# Patient Record
Sex: Female | Born: 1975 | Hispanic: No | Marital: Married | Smoking: Never smoker
Health system: Southern US, Community
[De-identification: ages and names within clinical notes are randomized; demographics above are authoritative.]

## PROBLEM LIST (undated history)

## (undated) DIAGNOSIS — Z6721 Type B blood, Rh negative: Secondary | ICD-10-CM

## (undated) HISTORY — PX: COLPOSCOPY: SHX161

## (undated) HISTORY — DX: Type B blood, Rh negative: Z67.21

---

## 2005-08-06 ENCOUNTER — Other Ambulatory Visit: Admission: RE | Admit: 2005-08-06 | Discharge: 2005-08-06 | Payer: Self-pay | Admitting: Gynecology

## 2006-02-04 ENCOUNTER — Other Ambulatory Visit: Admission: RE | Admit: 2006-02-04 | Discharge: 2006-02-04 | Payer: Self-pay | Admitting: Gynecology

## 2006-10-18 ENCOUNTER — Other Ambulatory Visit: Admission: RE | Admit: 2006-10-18 | Discharge: 2006-10-18 | Payer: Self-pay | Admitting: Gynecology

## 2007-01-03 ENCOUNTER — Ambulatory Visit (HOSPITAL_BASED_OUTPATIENT_CLINIC_OR_DEPARTMENT_OTHER): Admission: RE | Admit: 2007-01-03 | Discharge: 2007-01-03 | Payer: Self-pay | Admitting: Gynecology

## 2007-01-03 HISTORY — PX: OTHER SURGICAL HISTORY: SHX169

## 2007-04-18 ENCOUNTER — Other Ambulatory Visit: Admission: RE | Admit: 2007-04-18 | Discharge: 2007-04-18 | Payer: Self-pay | Admitting: Gynecology

## 2007-10-31 ENCOUNTER — Other Ambulatory Visit: Admission: RE | Admit: 2007-10-31 | Discharge: 2007-10-31 | Payer: Self-pay | Admitting: Gynecology

## 2008-11-05 ENCOUNTER — Other Ambulatory Visit: Admission: RE | Admit: 2008-11-05 | Discharge: 2008-11-05 | Payer: Self-pay | Admitting: Gynecology

## 2008-11-05 ENCOUNTER — Ambulatory Visit: Payer: Self-pay | Admitting: Women's Health

## 2008-11-05 ENCOUNTER — Encounter: Payer: Self-pay | Admitting: Women's Health

## 2009-11-07 ENCOUNTER — Ambulatory Visit: Payer: Self-pay | Admitting: Women's Health

## 2009-11-07 ENCOUNTER — Other Ambulatory Visit: Admission: RE | Admit: 2009-11-07 | Discharge: 2009-11-07 | Payer: Self-pay | Admitting: Gynecology

## 2010-11-17 ENCOUNTER — Other Ambulatory Visit: Payer: Self-pay | Admitting: Women's Health

## 2010-11-17 ENCOUNTER — Encounter (INDEPENDENT_AMBULATORY_CARE_PROVIDER_SITE_OTHER): Payer: BC Managed Care – PPO | Admitting: Women's Health

## 2010-11-17 ENCOUNTER — Other Ambulatory Visit (HOSPITAL_COMMUNITY)
Admission: RE | Admit: 2010-11-17 | Discharge: 2010-11-17 | Disposition: A | Discharge status: Home / Self Care | Payer: BC Managed Care – PPO | Source: Ambulatory Visit | Attending: Gynecology | Admitting: Gynecology

## 2010-11-17 DIAGNOSIS — Z01419 Encounter for gynecological examination (general) (routine) without abnormal findings: Secondary | ICD-10-CM

## 2010-11-17 DIAGNOSIS — Z13220 Encounter for screening for lipoid disorders: Secondary | ICD-10-CM

## 2010-11-17 DIAGNOSIS — Z124 Encounter for screening for malignant neoplasm of cervix: Secondary | ICD-10-CM | POA: Insufficient documentation

## 2011-02-02 NOTE — H&P (Signed)
Natasha Clark, Natasha Clark NO.:  [id number]   MEDICAL RECORD NO.:  [id number]        PATIENT TYPE:  HAMB   LOCATION:                               FACILITY:  [facility]   PHYSICIAN:  Juan H. Lily Peer, M.D.DATE OF BIRTH:  09-Jan-1976   DATE OF ADMISSION:  01/03/2007  DATE OF DISCHARGE:                              HISTORY & PHYSICAL   NOTE:  Patient scheduled for surgery on Friday, April 18th at 1:00 p.m.,  in [facility].  Please have history and physical  available.   CHIEF COMPLAINT:  Cervical dysplasia.   HISTORY:  The patient is a 35 year old gravida 0, who had been seen in  the office on March 7th for further evaluation of abnormal Pap smears.  Her records indicated that in November of 2006, she had an atypical  squamous cells of undetermined significance on her Pap smear.  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  She  underwent a detailed colposcopic evaluation of the external genitalia  and the cervix on March 7th, and the biopsies had demonstrated CIN I at  the 11:00, 1:00 and 4:00 position, and ECC was benign.  The patient is  scheduled to undergo a CO2 laser ablation and cervical dysplasia at  [facility] on Friday, April 18th at 1:00 p.m.   PAST MEDICAL HISTORY:  THE PATIENT DENIES ANY ALLERGIES.  She is a blood  type B negative.  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  Maternal  grandmother:  History of diabetes.   PHYSICAL EXAMINATION:  GENERAL:  Patient is a well-developed, well-  nourished female.  HEENT:  Unremarkable.  NECK:  Supple, trachea midline.  No carotid bruits, no thyromegaly.  LUNGS:  Clear to auscultation.  No rhonchi, no wheezes.  HEART:  Regular rate and rhythm.  No murmurs or gallops.  BREAST:  Exam not done.  ABDOMEN:  Soft,  nontender.  No rebound or guarding.  PELVIC:  Bartholin, urethra, Skene was within normal limits.  Vagina and  cervix as described above.  Uterus anteverted, normal size shape and  consistency.  Adnexa:  No masses or tenderness.  RECTAL:  Exam deferred.   ASSESSMENT:  A 35 year old gravida 0, with extensive CIN I of the  ectocervix, is scheduled to undergo CO2 laser ablation on Friday, April  18th, at 1:00 p.m. at [facility].  The risks, benefits  and pros and cons of the procedure were discussed with the patient, and  literature and information have been provided.  All  questions were answered, and will follow accordingly as planned.  Patient scheduled for CO2 laser ablation with cervical dysplasia on  Friday, April 18th, at 1:00 p.m. at [facility].  Please  have history and physical available.      Juan H. Lily Peer, M.D.  Electronically Signed     JHF/MEDQ  D:  01/01/2007  T:  01/01/2007  Job:  [id number]

## 2011-02-02 NOTE — Op Note (Signed)
[name], [name] NO.:  [id number]   MEDICAL RECORD NO.:  [id number]          PATIENT TYPE:  AMB   LOCATION:  [facility]                         FACILITY:  [facility]   PHYSICIAN:  Juan H. Lily Peer, M.D.DATE OF BIRTH:  03-12-1976   DATE OF PROCEDURE:  01/03/2007  DATE OF DISCHARGE:                               OPERATIVE REPORT   INDICATION FOR OPERATION:  A 35 year old gravida 0 who had been  evaluated in the office and was found to have atypical squamous cells of  undetermined significance on a Pap smear in November, 2006.  Her follow-  up Pap smear on May 21 was negative, but on February, 2008, her Pap  smear demonstrated CIN I/VIN I HPV changes.  Her hybrid caprate PV  demonstrated high risk.  She underwent colposcopic-directed biopsies  which confirmed that she had CIN I on three different areas on the  ectocervix at 11, 1 and 4 o'clock position.   PREOPERATIVE DIAGNOSIS:  Extensive cervical intraepithelial neoplasia I.   POSTOPERATIVE DIAGNOSIS:  Extensive cervical intraepithelial neoplasia  I.   ANESTHESIA:  General endotracheal anesthesia.   PROCEDURE PERFORMED:  CO2 laser ablation of cervical dysplasia.   DESCRIPTION OF OPERATION:  After the patient was adequately counseled,  she was taken to the operating room, where she underwent a general  endotracheal anesthesia.  She was placed in the low lithotomy position.  The vagina and perineum were prepped and draped in the usual sterile  fashion.  Wet towels were placed around the perineum.  A titanium-coated  speculum was inserted into the vaginal vault.  Prior to this, the  bladder was evacuated of its contents for approximately 50 cc.   The colposcope was brought into view and with the attachment of the CO2  laser, was set at 8 watts.  Acetic acid was applied to redelineate the  areas of the dysplastic cells noted on colposcopic evaluation in the  office.  The area was demarcated in a circumferential  manner.  The  ectocervix was ablated to a depth of 3-4 mm in a brush-like technique to  ablate the ectocervix all the way to the level of the transformation  zone.  Monsel solution was used for hemostasis followed by placement of  Estrace vaginal cream.  The patient was extubated, transferred to the  recovery room with stable vital signs.  Blood loss was minimal.  Fluid  resuscitation consisted of 700 cc of lactated Ringer's.      Juan H. Lily Peer, M.D.  Electronically Signed     JHF/MEDQ  D:  01/03/2007  T:  01/03/2007  Job:  [id number]

## 2011-11-20 DIAGNOSIS — N870 Mild cervical dysplasia: Secondary | ICD-10-CM | POA: Insufficient documentation

## 2011-11-20 DIAGNOSIS — Z6721 Type B blood, Rh negative: Secondary | ICD-10-CM | POA: Insufficient documentation

## 2011-11-26 ENCOUNTER — Encounter: Payer: BC Managed Care – PPO | Admitting: Women's Health

## 2011-11-29 ENCOUNTER — Other Ambulatory Visit: Payer: Self-pay | Admitting: Gynecology

## 2011-11-29 ENCOUNTER — Ambulatory Visit (INDEPENDENT_AMBULATORY_CARE_PROVIDER_SITE_OTHER): Payer: BC Managed Care – PPO | Admitting: Women's Health

## 2011-11-29 ENCOUNTER — Encounter: Payer: Self-pay | Admitting: Women's Health

## 2011-11-29 ENCOUNTER — Other Ambulatory Visit (HOSPITAL_COMMUNITY)
Admission: RE | Admit: 2011-11-29 | Discharge: 2011-11-29 | Disposition: A | Discharge status: Home / Self Care | Payer: BC Managed Care – PPO | Source: Ambulatory Visit | Attending: Obstetrics and Gynecology | Admitting: Obstetrics and Gynecology

## 2011-11-29 VITALS — BP 114/74 | Ht 63.0 in | Wt 121.5 lb

## 2011-11-29 DIAGNOSIS — Z01419 Encounter for gynecological examination (general) (routine) without abnormal findings: Secondary | ICD-10-CM

## 2011-11-29 DIAGNOSIS — IMO0001 Reserved for inherently not codable concepts without codable children: Secondary | ICD-10-CM

## 2011-11-29 DIAGNOSIS — Z309 Encounter for contraceptive management, unspecified: Secondary | ICD-10-CM

## 2011-11-29 LAB — CBC WITH DIFFERENTIAL/PLATELET
Basophils Relative: 0 % (ref 0–1)
Eosinophils Absolute: 0.1 10*3/uL (ref 0.0–0.7)
HCT: 40.6 % (ref 36.0–46.0)
Hemoglobin: 13.2 g/dL (ref 12.0–15.0)
MCH: 28.4 pg (ref 26.0–34.0)
MCHC: 32.5 g/dL (ref 30.0–36.0)
Monocytes Absolute: 0.5 10*3/uL (ref 0.1–1.0)
Monocytes Relative: 7 % (ref 3–12)

## 2011-11-29 MED ORDER — NORETHIN ACE-ETH ESTRAD-FE 1-20 MG-MCG PO TABS: 1.0000 | ORAL_TABLET | Freq: Every day | ORAL | Status: DC

## 2011-11-29 NOTE — Patient Instructions (Signed)

## 2011-11-29 NOTE — Progress Notes (Signed)
Natasha Clark [birth year] [id number]    History:    The patient presents for annual exam.  Monthly 2-3 day cycle on Loestrin 1/20 without complaint. History of CIN-1 with positive HR HPV, CO2 laser in 08 with normal paps after. Same partner for years.   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   ROS:  A  ROS was performed and pertinent positives and negatives are included in the history.  Exam:  Filed Vitals:   11/29/11 1507  BP: 114/74    General appearance:  Normal Head/Neck:  Normal, without cervical or supraclavicular adenopathy. Thyroid:  Symmetrical, normal in size, without palpable masses or nodularity. Respiratory  Effort:  Normal  Auscultation:  Clear without wheezing or rhonchi Cardiovascular  Auscultation:  Regular rate, without rubs, murmurs or gallops  Edema/varicosities:  Not grossly evident Abdominal  Soft,nontender, without masses, guarding or rebound.  Liver/spleen:  No organomegaly noted  Hernia:  None appreciated  Skin  Inspection:  Grossly normal  Palpation:  Grossly normal Neurologic/psychiatric  Orientation:  Normal with appropriate conversation.  Mood/affect:  Normal  Genitourinary    Breasts: Examined lying and sitting.     Right: Without masses, retractions, discharge or axillary adenopathy.     Left: Without masses, retractions, discharge or axillary adenopathy.   Inguinal/mons:  Normal without inguinal adenopathy  External genitalia:  Normal  BUS/Urethra/Skene's glands:  Normal  Bladder:  Normal  Vagina:  Normal  Cervix:  Normal  Uterus:   normal in size, shape and contour.  Midline and mobile  Adnexa/parametria:     Rt: Without masses or tenderness.   Lt: Without masses or tenderness.  Anus and perineum: Normal  Digital rectal exam: Normal sphincter tone without palpated masses or tenderness  Assessment/Plan:   36 y.o. SWF G0 for annual exam with no complaints.  CO2 laser 2008 for CIN-1/positive HR HPV normal paps after  Plan: Loestrin 1/20 prescription, proper use, slight risk for blood clots and strokes reviewed. Has an extremely healthy lifestyle encouraged to continue exercise and diet. CBC, UA, Pap. Had excellent lipid profile one year ago.    Harrington Challenger [facility], 5:36 PM 11/29/2011

## 2011-11-30 LAB — URINALYSIS W MICROSCOPIC + REFLEX CULTURE
Bilirubin Urine: NEGATIVE
Casts: NONE SEEN
Glucose, UA: NEGATIVE mg/dL
Protein, ur: NEGATIVE mg/dL
Squamous Epithelial / LPF: NONE SEEN

## 2012-11-10 ENCOUNTER — Other Ambulatory Visit: Payer: Self-pay

## 2012-11-10 DIAGNOSIS — IMO0001 Reserved for inherently not codable concepts without codable children: Secondary | ICD-10-CM

## 2012-11-10 MED ORDER — NORETHIN ACE-ETH ESTRAD-FE 1-20 MG-MCG PO TABS: 1.0000 | ORAL_TABLET | Freq: Every day | ORAL | Status: DC

## 2012-11-14 MED ORDER — NORETHIN ACE-ETH ESTRAD-FE 1-20 MG-MCG PO TABS: 1.0000 | ORAL_TABLET | Freq: Every day | ORAL | Status: DC

## 2012-11-14 NOTE — Telephone Encounter (Signed)
Pt called and Rx was never sent to pharmacy for birth control pills, rx re-sent.

## 2012-11-14 NOTE — Addendum Note (Signed)
Addended by: Aura Camps on: 11/14/2012 09:04 AM   Modules accepted: Orders

## 2012-12-03 ENCOUNTER — Encounter: Payer: Self-pay | Admitting: Women's Health

## 2012-12-03 ENCOUNTER — Ambulatory Visit (INDEPENDENT_AMBULATORY_CARE_PROVIDER_SITE_OTHER): Payer: BC Managed Care – PPO | Admitting: Women's Health

## 2012-12-03 ENCOUNTER — Other Ambulatory Visit (HOSPITAL_COMMUNITY)
Admission: RE | Admit: 2012-12-03 | Discharge: 2012-12-03 | Disposition: A | Discharge status: Home / Self Care | Payer: BC Managed Care – PPO | Source: Ambulatory Visit | Attending: Obstetrics and Gynecology | Admitting: Obstetrics and Gynecology

## 2012-12-03 VITALS — BP 110/70 | Ht 63.0 in | Wt 127.0 lb

## 2012-12-03 DIAGNOSIS — Z01419 Encounter for gynecological examination (general) (routine) without abnormal findings: Secondary | ICD-10-CM | POA: Insufficient documentation

## 2012-12-03 DIAGNOSIS — IMO0001 Reserved for inherently not codable concepts without codable children: Secondary | ICD-10-CM

## 2012-12-03 DIAGNOSIS — N870 Mild cervical dysplasia: Secondary | ICD-10-CM

## 2012-12-03 DIAGNOSIS — Z309 Encounter for contraceptive management, unspecified: Secondary | ICD-10-CM

## 2012-12-03 DIAGNOSIS — B977 Papillomavirus as the cause of diseases classified elsewhere: Secondary | ICD-10-CM

## 2012-12-03 MED ORDER — NORETHIN ACE-ETH ESTRAD-FE 1-20 MG-MCG PO TABS: 1.0000 | ORAL_TABLET | Freq: Every day | ORAL | Status: DC

## 2012-12-03 NOTE — Progress Notes (Signed)
Natasha Clark 1976/06/13 [id number]    History:    The patient presents for annual exam.  Right monthly cycle on Loestrin 1/20. Same partner. CIN-1 with positive HR HPV 2008 with CO2 laser treatment normal Paps after. Desires no children.   Past medical history, past surgical history, family history and social history were all reviewed and documented in the EPIC chart. Periodontist.   ROS:  A  ROS was performed and pertinent positives and negatives are included in the history.  Exam:  Filed Vitals:   12/03/12 0759  BP: 110/70    General appearance:  Normal Head/Neck:  Normal, without cervical or supraclavicular adenopathy. Thyroid:  Symmetrical, normal in size, without palpable masses or nodularity. Respiratory  Effort:  Normal  Auscultation:  Clear without wheezing or rhonchi Cardiovascular  Auscultation:  Regular rate, without rubs, murmurs or gallops  Edema/varicosities:  Not grossly evident Abdominal  Soft,nontender, without masses, guarding or rebound.  Liver/spleen:  No organomegaly noted  Hernia:  None appreciated  Skin  Inspection:  Grossly normal  Palpation:  Grossly normal Neurologic/psychiatric  Orientation:  Normal with appropriate conversation.  Mood/affect:  Normal  Genitourinary    Breasts: Examined lying and sitting.     Right: Without masses, retractions, discharge or axillary adenopathy.     Left: Without masses, retractions, discharge or axillary adenopathy.   Inguinal/mons:  Normal without inguinal adenopathy  External genitalia:  Normal  BUS/Urethra/Skene's glands:  Normal  Bladder:  Normal  Vagina:  Normal  Cervix:  Normal  Uterus:  normal in size, shape and contour.  Midline and mobile  Adnexa/parametria:     Rt: Without masses or tenderness.   Lt: Without masses or tenderness.  Anus and perineum: Normal  Digital rectal exam: Normal sphincter tone without palpated masses or tenderness  Assessment/Plan:  37 y.o. SWF G0  for annual exam  with no complaints.  LG SIL/CIN-1 HR HPV - CO2 laser 2008, normal Paps after  Plan: Loestrin 1/20, prescription, proper use, slight risk for blood clots and strokes reviewed. Other contraception options reviewed/declines. SBE's, annual mammogram at 38, continue regular exercise/running, calcium rich diet, vitamin D 1000 daily encouraged. Pap only, normal labs last year.    Harrington Challenger [facility], 8:33 AM 12/03/2012

## 2012-12-03 NOTE — Patient Instructions (Signed)

## 2012-12-03 NOTE — Addendum Note (Signed)
Addended by: Dayna Barker on: 12/03/2012 08:46 AM   Modules accepted: Orders

## 2012-12-04 LAB — URINALYSIS W MICROSCOPIC + REFLEX CULTURE
Bacteria, UA: NONE SEEN
Bilirubin Urine: NEGATIVE
Casts: NONE SEEN
Crystals: NONE SEEN
Glucose, UA: NEGATIVE mg/dL
Specific Gravity, Urine: 1.03 (ref 1.005–1.030)
Squamous Epithelial / LPF: NONE SEEN

## 2013-12-04 ENCOUNTER — Encounter: Payer: BC Managed Care – PPO | Admitting: Women's Health

## 2013-12-07 ENCOUNTER — Encounter: Payer: BC Managed Care – PPO | Admitting: Women's Health

## 2013-12-15 ENCOUNTER — Encounter: Payer: Self-pay | Admitting: Women's Health

## 2013-12-15 ENCOUNTER — Ambulatory Visit (INDEPENDENT_AMBULATORY_CARE_PROVIDER_SITE_OTHER): Payer: BC Managed Care – PPO | Admitting: Women's Health

## 2013-12-15 ENCOUNTER — Other Ambulatory Visit (HOSPITAL_COMMUNITY)
Admission: RE | Admit: 2013-12-15 | Discharge: 2013-12-15 | Disposition: A | Discharge status: Home / Self Care | Payer: BC Managed Care – PPO | Source: Ambulatory Visit | Attending: Women's Health | Admitting: Women's Health

## 2013-12-15 VITALS — BP 110/72 | Ht 63.5 in | Wt 123.2 lb

## 2013-12-15 DIAGNOSIS — Z01419 Encounter for gynecological examination (general) (routine) without abnormal findings: Secondary | ICD-10-CM

## 2013-12-15 DIAGNOSIS — IMO0001 Reserved for inherently not codable concepts without codable children: Secondary | ICD-10-CM

## 2013-12-15 DIAGNOSIS — Z1151 Encounter for screening for human papillomavirus (HPV): Secondary | ICD-10-CM | POA: Insufficient documentation

## 2013-12-15 DIAGNOSIS — Z309 Encounter for contraceptive management, unspecified: Secondary | ICD-10-CM

## 2013-12-15 MED ORDER — NORETHIN ACE-ETH ESTRAD-FE 1-20 MG-MCG PO TABS: 1.0000 | ORAL_TABLET | Freq: Every day | ORAL | Status: DC

## 2013-12-15 NOTE — Addendum Note (Signed)
Addended by: Richardson ChiquitoWILKINSON, KARI S on: 12/15/2013 09:39 AM   Modules accepted: Orders

## 2013-12-15 NOTE — Progress Notes (Signed)
Elveria RisingCortney Javier 1976-05-06 [id number]    History:    Presents for annual exam with no complaints.  Light monthly cycle on loestrin /same partner/desires no children. 2008 laser for LGSIL with positive HR HPV, with normal Paps after. Had normal CBC, lipid panel, blood sugar for life insurance policy.  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  ROS:  A  ROS was performed and pertinent positives and negatives are included.  Exam:  Filed Vitals:   12/15/13 0821  BP: 110/72    General appearance:  Normal Thyroid:  Symmetrical, normal in size, without palpable masses or nodularity. Respiratory  Auscultation:  Clear without wheezing or rhonchi Cardiovascular  Auscultation:  Regular rate, without rubs, murmurs or gallops  Edema/varicosities:  Not grossly evident Abdominal  Soft,nontender, without masses, guarding or rebound.  Liver/spleen:  No organomegaly noted  Hernia:  None appreciated  Skin  Inspection:  Grossly normal   Breasts: Examined lying and sitting.     Right: Without masses, retractions, discharge or axillary adenopathy.     Left: Without masses, retractions, discharge or axillary adenopathy. Gentitourinary   Inguinal/mons:  Normal without inguinal adenopathy  External genitalia:  Normal  BUS/Urethra/Skene's glands:  Normal  Vagina:  Normal  Cervix:  Normal  Uterus:  normal in size, shape and contour.  Midline and mobile  Adnexa/parametria:     Rt: Without masses or tenderness.   Lt: Without masses or tenderness.  Anus and perineum: Normal  Digital rectal exam: Normal sphincter tone without palpated masses or tenderness  Assessment/Plan:  38 y.o. SWF G0 for annual exam with no complaints.  2008 CIN-1 with positive HR HPV with normal Paps after  Plan: Loestrin 1/20 prescription, proper use, slight risk for blood clots and  strokes reviewed. SBE's, annual screening mammogram at 38, continue healthy diet, regular exercise. Vitamin D 1000 daily, calcium rich diet encouraged. Pap with HR HPV typing. New screening guidelines reviewed. Labs normal 6 months ago at life insurance screening.   Harrington ChallengerYOUNG,Lyn Joens J [facility] LLCWHNP, 9:04 AM 12/15/2013

## 2013-12-15 NOTE — Patient Instructions (Signed)

## 2014-12-17 ENCOUNTER — Encounter: Payer: BC Managed Care – PPO | Admitting: Women's Health

## 2014-12-31 ENCOUNTER — Ambulatory Visit (INDEPENDENT_AMBULATORY_CARE_PROVIDER_SITE_OTHER): Payer: BLUE CROSS/BLUE SHIELD | Admitting: Women's Health

## 2014-12-31 ENCOUNTER — Encounter: Payer: Self-pay | Admitting: Women's Health

## 2014-12-31 VITALS — BP 120/82 | Ht 63.0 in | Wt 128.0 lb

## 2014-12-31 DIAGNOSIS — Z01419 Encounter for gynecological examination (general) (routine) without abnormal findings: Secondary | ICD-10-CM | POA: Diagnosis not present

## 2014-12-31 DIAGNOSIS — Z3041 Encounter for surveillance of contraceptive pills: Secondary | ICD-10-CM

## 2014-12-31 MED ORDER — NORETHIN ACE-ETH ESTRAD-FE 1-20 MG-MCG PO TABS: 1.0000 | ORAL_TABLET | Freq: Every day | ORAL | Status: DC

## 2014-12-31 NOTE — Patient Instructions (Signed)

## 2014-12-31 NOTE — Progress Notes (Signed)
Natasha Clark May 16, 1976 [id number]    History:    Presents for annual exam.  Monthly cycle on Loestrin without complaint. Same partner. Desires no children. 2008 CO2 laser for CIN-1 with high risk HPV. Pap 2015 normal with negative HR HPV. Has not had a baseline mammogram.  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 Parents healthy.  ROS:  A ROS was performed and pertinent positives and negatives are included.  Exam:  Filed Vitals:   12/31/14 0759  BP: 120/82    General appearance:  Normal Thyroid:  Symmetrical, normal in size, without palpable masses or nodularity. Respiratory  Auscultation:  Clear without wheezing or rhonchi Cardiovascular  Auscultation:  Regular rate, without rubs, murmurs or gallops  Edema/varicosities:  Not grossly evident Abdominal  Soft,nontender, without masses, guarding or rebound.  Liver/spleen:  No organomegaly noted  Hernia:  None appreciated  Skin  Inspection:  Grossly normal   Breasts: Examined lying and sitting.     Right: Without masses, retractions, discharge or axillary adenopathy.     Left: Without masses, retractions, discharge or axillary adenopathy. Gentitourinary   Inguinal/mons:  Normal without inguinal adenopathy  External genitalia:  Normal  BUS/Urethra/Skene's glands:  Normal  Vagina:  Normal  Cervix:  Normal  Uterus:   normal in size, shape and contour.  Midline and mobile  Adnexa/parametria:     Rt: Without masses or tenderness.   Lt: Without masses or tenderness.  Anus and perineum: Normal  Digital rectal exam: Normal sphincter tone without palpated masses or tenderness  Assessment/Plan:  39 y.o. SWF G0 for annual exam with no complaints.  2008 CO2 laser CIN-1 normal Paps after. Monthly cycle on Loestrin/same partner  Plan: Loestrin 1/20 prescription, proper use, slight risk for  blood clots and strokes reviewed. SBE's, screening mammogram, breast center information given, encouraged 3-D tomography. Declines blood work today will check next year. Continue healthy lifestyle of regular exercise, calcium rich diet, vitamin D 1000 daily encouraged. UA. Pap normal with negative HR HPV 2015, no Pap today new screening guidelines reviewed.    Harrington ChallengerYOUNG,Blase Beckner J [facility] HospitalWHNP, 9:45 AM 12/31/2014

## 2015-01-01 LAB — URINALYSIS W MICROSCOPIC + REFLEX CULTURE
BACTERIA UA: NONE SEEN
Bilirubin Urine: NEGATIVE
Casts: NONE SEEN
Crystals: NONE SEEN
Glucose, UA: NEGATIVE mg/dL
HGB URINE DIPSTICK: NEGATIVE
KETONES UR: NEGATIVE mg/dL
Leukocytes, UA: NEGATIVE
NITRITE: NEGATIVE
PH: 6 (ref 5.0–8.0)
PROTEIN: NEGATIVE mg/dL
SPECIFIC GRAVITY, URINE: 1.021 (ref 1.005–1.030)
SQUAMOUS EPITHELIAL / LPF: NONE SEEN
Urobilinogen, UA: 0.2 mg/dL (ref 0.0–1.0)

## 2015-11-07 ENCOUNTER — Other Ambulatory Visit: Payer: Self-pay

## 2015-11-07 DIAGNOSIS — Z1231 Encounter for screening mammogram for malignant neoplasm of breast: Secondary | ICD-10-CM

## 2015-11-18 ENCOUNTER — Ambulatory Visit
Admission: RE | Admit: 2015-11-18 | Discharge: 2015-11-18 | Disposition: A | Discharge status: Home / Self Care | Payer: BLUE CROSS/BLUE SHIELD | Source: Ambulatory Visit

## 2015-11-18 DIAGNOSIS — Z1231 Encounter for screening mammogram for malignant neoplasm of breast: Secondary | ICD-10-CM

## 2016-01-06 ENCOUNTER — Ambulatory Visit (INDEPENDENT_AMBULATORY_CARE_PROVIDER_SITE_OTHER): Payer: BLUE CROSS/BLUE SHIELD | Admitting: Women's Health

## 2016-01-06 ENCOUNTER — Encounter: Payer: Self-pay | Admitting: Women's Health

## 2016-01-06 VITALS — BP 119/74 | Ht 63.0 in | Wt 130.2 lb

## 2016-01-06 DIAGNOSIS — Z01419 Encounter for gynecological examination (general) (routine) without abnormal findings: Secondary | ICD-10-CM

## 2016-01-06 DIAGNOSIS — Z833 Family history of diabetes mellitus: Secondary | ICD-10-CM

## 2016-01-06 DIAGNOSIS — Z3041 Encounter for surveillance of contraceptive pills: Secondary | ICD-10-CM | POA: Diagnosis not present

## 2016-01-06 DIAGNOSIS — Z13220 Encounter for screening for lipoid disorders: Secondary | ICD-10-CM

## 2016-01-06 LAB — CBC WITH DIFFERENTIAL/PLATELET
BASOS ABS: 67 {cells}/uL (ref 0–200)
Basophils Relative: 1 %
EOS ABS: 268 {cells}/uL (ref 15–500)
Eosinophils Relative: 4 %
HEMATOCRIT: 40.9 % (ref 35.0–45.0)
HEMOGLOBIN: 14 g/dL (ref 11.7–15.5)
LYMPHS PCT: 32 %
Lymphs Abs: 2144 cells/uL (ref 850–3900)
MCH: 31.6 pg (ref 27.0–33.0)
MCHC: 34.2 g/dL (ref 32.0–36.0)
MCV: 92.3 fL (ref 80.0–100.0)
MONO ABS: 603 {cells}/uL (ref 200–950)
MPV: 10 fL (ref 7.5–12.5)
Monocytes Relative: 9 %
NEUTROS PCT: 54 %
Neutro Abs: 3618 cells/uL (ref 1500–7800)
Platelets: 233 10*3/uL (ref 140–400)
RBC: 4.43 MIL/uL (ref 3.80–5.10)
RDW: 13 % (ref 11.0–15.0)
WBC: 6.7 10*3/uL (ref 3.8–10.8)

## 2016-01-06 LAB — LIPID PANEL
CHOL/HDL RATIO: 2 ratio (ref <=5.0)
Cholesterol: 214 mg/dL — ABNORMAL HIGH (ref 125–200)
HDL: 107 mg/dL (ref >=46)
LDL Cholesterol: 98 mg/dL (ref <130)
TRIGLYCERIDES: 47 mg/dL (ref <150)
VLDL: 9 mg/dL (ref <30)

## 2016-01-06 LAB — GLUCOSE, RANDOM: GLUCOSE: 84 mg/dL (ref 65–99)

## 2016-01-06 MED ORDER — NORETHIN ACE-ETH ESTRAD-FE 1-20 MG-MCG PO TABS: 1.0000 | ORAL_TABLET | Freq: Every day | ORAL | Status: DC

## 2016-01-06 NOTE — Patient Instructions (Signed)
Health Maintenance, Female Adopting a healthy lifestyle and getting preventive care can go a long way to promote health and wellness. Talk with your health care provider about what schedule of regular examinations is right for you. This is a good chance for you to check in with your provider about disease prevention and staying healthy. In between checkups, there are plenty of things you can do on your own. Experts have done a lot of research about which lifestyle changes and preventive measures are most likely to keep you healthy. Ask your health care provider for more information. WEIGHT AND DIET  Eat a healthy diet  Be sure to include plenty of vegetables, fruits, low-fat dairy products, and lean protein.  Do not eat a lot of foods high in solid fats, added sugars, or salt.  Get regular exercise. This is one of the most important things you can do for your health.  Most adults should exercise for at least 150 minutes each week. The exercise should increase your heart rate and make you sweat (moderate-intensity exercise).  Most adults should also do strengthening exercises at least twice a week. This is in addition to the moderate-intensity exercise.  Maintain a healthy weight  Body mass index (BMI) is a measurement that can be used to identify possible weight problems. It estimates body fat based on height and weight. Your health care provider can help determine your BMI and help you achieve or maintain a healthy weight.  For females 20 years of age and older:   A BMI below 18.5 is considered underweight.  A BMI of 18.5 to 24.9 is normal.  A BMI of 25 to 29.9 is considered overweight.  A BMI of 30 and above is considered obese.  Watch levels of cholesterol and blood lipids  You should start having your blood tested for lipids and cholesterol at 42 years of age, then have this test every 5 years.  You may need to have your cholesterol levels checked more often if:  Your lipid  or cholesterol levels are high.  You are older than 42 years of age.  You are at high risk for heart disease.  CANCER SCREENING   Lung Cancer  Lung cancer screening is recommended for adults 55-80 years old who are at high risk for lung cancer because of a history of smoking.  A yearly low-dose CT scan of the lungs is recommended for people who:  Currently smoke.  Have quit within the past 15 years.  Have at least a 30-pack-year history of smoking. A pack year is smoking an average of one pack of cigarettes a day for 1 year.  Yearly screening should continue until it has been 15 years since you quit.  Yearly screening should stop if you develop a health problem that would prevent you from having lung cancer treatment.  Breast Cancer  Practice breast self-awareness. This means understanding how your breasts normally appear and feel.  It also means doing regular breast self-exams. Let your health care provider know about any changes, no matter how small.  If you are in your 20s or 30s, you should have a clinical breast exam (CBE) by a health care provider every 1-3 years as part of a regular health exam.  If you are 40 or older, have a CBE every year. Also consider having a breast X-ray (mammogram) every year.  If you have a family history of breast cancer, talk to your health care provider about genetic screening.  If you   are at high risk for breast cancer, talk to your health care provider about having an MRI and a mammogram every year.  Breast cancer gene (BRCA) assessment is recommended for women who have family members with BRCA-related cancers. BRCA-related cancers include:  Breast.  Ovarian.  Tubal.  Peritoneal cancers.  Results of the assessment will determine the need for genetic counseling and BRCA1 and BRCA2 testing. Cervical Cancer Your health care provider may recommend that you be screened regularly for cancer of the pelvic organs (ovaries, uterus, and  vagina). This screening involves a pelvic examination, including checking for microscopic changes to the surface of your cervix (Pap test). You may be encouraged to have this screening done every 3 years, beginning at age 21.  For women ages 30-65, health care providers may recommend pelvic exams and Pap testing every 3 years, or they may recommend the Pap and pelvic exam, combined with testing for human papilloma virus (HPV), every 5 years. Some types of HPV increase your risk of cervical cancer. Testing for HPV may also be done on women of any age with unclear Pap test results.  Other health care providers may not recommend any screening for nonpregnant women who are considered low risk for pelvic cancer and who do not have symptoms. Ask your health care provider if a screening pelvic exam is right for you.  If you have had past treatment for cervical cancer or a condition that could lead to cancer, you need Pap tests and screening for cancer for at least 20 years after your treatment. If Pap tests have been discontinued, your risk factors (such as having a new sexual partner) need to be reassessed to determine if screening should resume. Some women have medical problems that increase the chance of getting cervical cancer. In these cases, your health care provider may recommend more frequent screening and Pap tests. Colorectal Cancer  This type of cancer can be detected and often prevented.  Routine colorectal cancer screening usually begins at 42 years of age and continues through 42 years of age.  Your health care provider may recommend screening at an earlier age if you have risk factors for colon cancer.  Your health care provider may also recommend using home test kits to check for hidden blood in the stool.  A small camera at the end of a tube can be used to examine your colon directly (sigmoidoscopy or colonoscopy). This is done to check for the earliest forms of colorectal  cancer.  Routine screening usually begins at age 50.  Direct examination of the colon should be repeated every 5-10 years through 42 years of age. However, you may need to be screened more often if early forms of precancerous polyps or small growths are found. Skin Cancer  Check your skin from head to toe regularly.  Tell your health care provider about any new moles or changes in moles, especially if there is a change in a mole's shape or color.  Also tell your health care provider if you have a mole that is larger than the size of a pencil eraser.  Always use sunscreen. Apply sunscreen liberally and repeatedly throughout the day.  Protect yourself by wearing long sleeves, pants, a wide-brimmed hat, and sunglasses whenever you are outside. HEART DISEASE, DIABETES, AND HIGH BLOOD PRESSURE   High blood pressure causes heart disease and increases the risk of stroke. High blood pressure is more likely to develop in:  People who have blood pressure in the high end   of the normal range (130-139/85-89 mm Hg).  People who are overweight or obese.  People who are African American.  If you are 38-23 years of age, have your blood pressure checked every 3-5 years. If you are 61 years of age or older, have your blood pressure checked every year. You should have your blood pressure measured twice--once when you are at a hospital or clinic, and once when you are not at a hospital or clinic. Record the average of the two measurements. To check your blood pressure when you are not at a hospital or clinic, you can use:  An automated blood pressure machine at a pharmacy.  A home blood pressure monitor.  If you are between 45 years and 39 years old, ask your health care provider if you should take aspirin to prevent strokes.  Have regular diabetes screenings. This involves taking a blood sample to check your fasting blood sugar level.  If you are at a normal weight and have a low risk for diabetes,  have this test once every three years after 42 years of age.  If you are overweight and have a high risk for diabetes, consider being tested at a younger age or more often. PREVENTING INFECTION  Hepatitis B  If you have a higher risk for hepatitis B, you should be screened for this virus. You are considered at high risk for hepatitis B if:  You were born in a country where hepatitis B is common. Ask your health care provider which countries are considered high risk.  Your parents were born in a high-risk country, and you have not been immunized against hepatitis B (hepatitis B vaccine).  You have HIV or AIDS.  You use needles to inject street drugs.  You live with someone who has hepatitis B.  You have had sex with someone who has hepatitis B.  You get hemodialysis treatment.  You take certain medicines for conditions, including cancer, organ transplantation, and autoimmune conditions. Hepatitis C  Blood testing is recommended for:  Everyone born from [birth year] through 1967.  Anyone with known risk factors for hepatitis C. Sexually transmitted infections (STIs)  You should be screened for sexually transmitted infections (STIs) including gonorrhea and chlamydia if:  You are sexually active and are younger than 42 years of age.  You are older than 42 years of age and your health care provider tells you that you are at risk for this type of infection.  Your sexual activity has changed since you were last screened and you are at an increased risk for chlamydia or gonorrhea. Ask your health care provider if you are at risk.  If you do not have HIV, but are at risk, it may be recommended that you take a prescription medicine daily to prevent HIV infection. This is called pre-exposure prophylaxis (PrEP). You are considered at risk if:  You are sexually active and do not regularly use condoms or know the HIV status of your partner(s).  You take drugs by injection.  You are sexually  active with a partner who has HIV. Talk with your health care provider about whether you are at high risk of being infected with HIV. If you choose to begin PrEP, you should first be tested for HIV. You should then be tested every 3 months for as long as you are taking PrEP.  PREGNANCY   If you are premenopausal and you may become pregnant, ask your health care provider about preconception counseling.  If you may  become pregnant, take 400 to 800 micrograms (mcg) of folic acid every day.  If you want to prevent pregnancy, talk to your health care provider about birth control (contraception). OSTEOPOROSIS AND MENOPAUSE   Osteoporosis is a disease in which the bones lose minerals and strength with aging. This can result in serious bone fractures. Your risk for osteoporosis can be identified using a bone density scan.  If you are 61 years of age or older, or if you are at risk for osteoporosis and fractures, ask your health care provider if you should be screened.  Ask your health care provider whether you should take a calcium or vitamin D supplement to lower your risk for osteoporosis.  Menopause may have certain physical symptoms and risks.  Hormone replacement therapy may reduce some of these symptoms and risks. Talk to your health care provider about whether hormone replacement therapy is right for you.  HOME CARE INSTRUCTIONS   Schedule regular health, dental, and eye exams.  Stay current with your immunizations.   Do not use any tobacco products including cigarettes, chewing tobacco, or electronic cigarettes.  If you are pregnant, do not drink alcohol.  If you are breastfeeding, limit how much and how often you drink alcohol.  Limit alcohol intake to no more than 1 drink per day for nonpregnant women. One drink equals 12 ounces of beer, 5 ounces of wine, or 1 ounces of hard liquor.  Do not use street drugs.  Do not share needles.  Ask your health care provider for help if  you need support or information about quitting drugs.  Tell your health care provider if you often feel depressed.  Tell your health care provider if you have ever been abused or do not feel safe at home.   This information is not intended to replace advice given to you by your health care provider. Make sure you discuss any questions you have with your health care provider.   Document Released: 03/19/2011 Document Revised: 09/24/2014 Document Reviewed: 08/05/2013 Elsevier Interactive Patient Education [employer].

## 2016-01-06 NOTE — Progress Notes (Signed)
Natasha Clark 11-05-1975 [id number]    History:    Presents for annual exam.  Monthly cycle on Loestrin without complaint. Same partner/desires no children. 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 Normal mammogram history.  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 Parents healthy.  ROS:  A ROS was performed and pertinent positives and negatives are included.  Exam:  Filed Vitals:   01/06/16 0812  BP: 119/74    General appearance:  Normal Thyroid:  Symmetrical, normal in size, without palpable masses or nodularity. Respiratory  Auscultation:  Clear without wheezing or rhonchi Cardiovascular  Auscultation:  Regular rate, without rubs, murmurs or gallops  Edema/varicosities:  Not grossly evident Abdominal  Soft,nontender, without masses, guarding or rebound.  Liver/spleen:  No organomegaly noted  Hernia:  None appreciated  Skin  Inspection:  Grossly normal   Breasts: Examined lying and sitting.     Right: Without masses, retractions, discharge or axillary adenopathy.     Left: Without masses, retractions, discharge or axillary adenopathy. Gentitourinary   Inguinal/mons:  Normal without inguinal adenopathy  External genitalia:  Normal  BUS/Urethra/Skene's glands:  Normal  Vagina:  Normal  Cervix:  Normal  Uterus:  normal in size, shape and contour.  Midline and mobile  Adnexa/parametria:     Rt: Without masses or tenderness.   Lt: Without masses or tenderness.  Anus and perineum: Normal  Digital rectal exam: Normal sphincter tone without palpated masses or tenderness  Assessment/Plan:  40 y.o. S WF G0 for annual exam with no complaints.  Light monthly cycle on Loestrin 2008 CO2 laser CIN-1 with normal Paps after  Plan: Loestrin prescription, proper use given and reviewed slight risk for blood clots and  strokes. SBE's, continue annual 3-D screening mammogram history of dense breasts. Continue healthy lifestyle of diet and exercise. CBC, lipid panel, glucose, UA, Pap. Pap normal negative HR HPV 2015.    Harrington ChallengerYOUNG,Rola Lennon J [facility] Surgery CtrWHNP, 11:47 AM 01/06/2016

## 2016-01-10 LAB — PAP IG W/ RFLX HPV ASCU

## 2017-01-11 ENCOUNTER — Encounter: Payer: Self-pay | Admitting: Gynecology

## 2017-01-11 ENCOUNTER — Encounter: Payer: BLUE CROSS/BLUE SHIELD | Admitting: Women's Health

## 2017-01-11 ENCOUNTER — Ambulatory Visit (INDEPENDENT_AMBULATORY_CARE_PROVIDER_SITE_OTHER): Payer: BLUE CROSS/BLUE SHIELD | Admitting: Gynecology

## 2017-01-11 ENCOUNTER — Encounter: Payer: BLUE CROSS/BLUE SHIELD | Admitting: Gynecology

## 2017-01-11 VITALS — BP 116/74 | Ht 64.0 in | Wt 131.0 lb

## 2017-01-11 DIAGNOSIS — Z01419 Encounter for gynecological examination (general) (routine) without abnormal findings: Secondary | ICD-10-CM | POA: Diagnosis not present

## 2017-01-11 DIAGNOSIS — Z3041 Encounter for surveillance of contraceptive pills: Secondary | ICD-10-CM

## 2017-01-11 LAB — COMPREHENSIVE METABOLIC PANEL
ALT: 10 U/L (ref 6–29)
AST: 14 U/L (ref 10–30)
Albumin: 4.2 g/dL (ref 3.6–5.1)
Alkaline Phosphatase: 40 U/L (ref 33–115)
BILIRUBIN TOTAL: 0.6 mg/dL (ref 0.2–1.2)
BUN: 16 mg/dL (ref 7–25)
CO2: 24 mmol/L (ref 20–31)
CREATININE: 1.01 mg/dL (ref 0.50–1.10)
Calcium: 9 mg/dL (ref 8.6–10.2)
Chloride: 104 mmol/L (ref 98–110)
GLUCOSE: 87 mg/dL (ref 65–99)
Potassium: 4.1 mmol/L (ref 3.5–5.3)
Sodium: 138 mmol/L (ref 135–146)
Total Protein: 6.5 g/dL (ref 6.1–8.1)

## 2017-01-11 LAB — CBC WITH DIFFERENTIAL/PLATELET
Basophils Absolute: 0 cells/uL (ref 0–200)
Basophils Relative: 0 %
Eosinophils Absolute: 207 cells/uL (ref 15–500)
Eosinophils Relative: 3 %
HCT: 40.9 % (ref 35.0–45.0)
Hemoglobin: 14.1 g/dL (ref 11.7–15.5)
Lymphocytes Relative: 36 %
Lymphs Abs: 2484 cells/uL (ref 850–3900)
MCH: 31.8 pg (ref 27.0–33.0)
MCHC: 34.5 g/dL (ref 32.0–36.0)
MCV: 92.1 fL (ref 80.0–100.0)
MPV: 9.7 fL (ref 7.5–12.5)
Monocytes Absolute: 483 cells/uL (ref 200–950)
Monocytes Relative: 7 %
NEUTROS PCT: 54 %
Neutro Abs: 3726 cells/uL (ref 1500–7800)
PLATELETS: 247 10*3/uL (ref 140–400)
RBC: 4.44 MIL/uL (ref 3.80–5.10)
RDW: 12.6 % (ref 11.0–15.0)
WBC: 6.9 10*3/uL (ref 3.8–10.8)

## 2017-01-11 LAB — LIPID PANEL
Cholesterol: 165 mg/dL (ref <200)
HDL: 86 mg/dL (ref >50)
LDL Cholesterol: 67 mg/dL (ref <100)
Total CHOL/HDL Ratio: 1.9 Ratio (ref <5.0)
Triglycerides: 60 mg/dL (ref <150)
VLDL: 12 mg/dL (ref <30)

## 2017-01-11 MED ORDER — NORETHIN ACE-ETH ESTRAD-FE 1-20 MG-MCG PO TABS: 1.0000 | ORAL_TABLET | Freq: Every day | ORAL | 4 refills | Status: DC

## 2017-01-11 NOTE — Progress Notes (Signed)
Natasha Clark 08-24-1976 [id number]   History:    41 y.o.  for annual gyn exam with no complaints today. Patient is doing well with regular menstrual cycle on Loestrin oral contraceptive pill.Same partner/desires no children. 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  Past medical history,surgical history, family history and social history were all reviewed and documented in the EPIC chart.  Gynecologic History Patient's last menstrual period was 01/01/2017. Contraception: OCP (estrogen/progesterone) Last Pap: 2017. Results were: normal Last mammogram: 2017. Results were: normal  Obstetric History OB History  Gravida Para Term Preterm AB Living  0            SAB TAB Ectopic Multiple Live Births                    ROS: A ROS was performed and pertinent positives and negatives are included in the history.  GENERAL: No fevers or chills. HEENT: No change in vision, no earache, sore throat or sinus congestion. NECK: No pain or stiffness. CARDIOVASCULAR: No chest pain or pressure. No palpitations. PULMONARY: No shortness of breath, cough or wheeze. GASTROINTESTINAL: No abdominal pain, nausea, vomiting or diarrhea, melena or bright red blood per rectum. GENITOURINARY: No urinary frequency, urgency, hesitancy or dysuria. MUSCULOSKELETAL: No joint or muscle pain, no back pain, no recent trauma. DERMATOLOGIC: No rash, no itching, no lesions. ENDOCRINE: No polyuria, polydipsia, no heat or cold intolerance. No recent change in weight. HEMATOLOGICAL: No anemia or easy bruising or bleeding. NEUROLOGIC: No headache, seizures, numbness, tingling or weakness. PSYCHIATRIC: No depression, no loss of interest in normal activity or change in sleep pattern.     Exam: chaperone present  BP 116/74   Ht  (1.626 m)   Wt 131 lb (59.4 kg)   LMP 01/01/2017   BMI 22.49 kg/m   Body mass index is 22.49 kg/m.  General appearance : Well developed well nourished female. No acute  distress HEENT: Eyes: no retinal hemorrhage or exudates,  Neck supple, trachea midline, no carotid bruits, no thyroidmegaly Lungs: Clear to auscultation, no rhonchi or wheezes, or rib retractions  Heart: Regular rate and rhythm, no murmurs or gallops Breast:Examined in sitting and supine position were symmetrical in appearance, no palpable masses or tenderness,  no skin retraction, no nipple inversion, no nipple discharge, no skin discoloration, no axillary or supraclavicular lymphadenopathy Abdomen: no palpable masses or tenderness, no rebound or guarding Extremities: no edema or skin discoloration or tenderness  Pelvic:  Bartholin, Urethra, Skene Glands: Within normal limits             Vagina: No gross lesions or discharge  Cervix: No gross lesions or discharge  Uterus  anteverted, normal size, shape and consistency, non-tender and mobile  Adnexa  Without masses or tenderness  Anus and perineum  normal   Rectovaginal  normal sphincter tone without palpated masses or tenderness             Hemoccult not indicated     Assessment/Plan:  41 y.o. female for annual exam doing well prescription refill for her Loestrin oral contraceptive pill was provided. She was provided with requisition to schedule her overdue mammogram. We discussed importance of monthly breast exams. We discussed importance of calcium vitamin D and weightbearing exercise for osteoporosis prevention. Pap smear not indicated this year. The following screening fasting blood work was ordered: Comprehensive metabolic panel, fasting lipid profile, and CBC.   Ok Edwards MD, 9:14 AM 01/11/2017

## 2017-01-30 ENCOUNTER — Encounter: Payer: Self-pay | Admitting: Gynecology

## 2017-08-13 ENCOUNTER — Other Ambulatory Visit: Payer: Self-pay | Admitting: Gynecology

## 2017-08-13 ENCOUNTER — Other Ambulatory Visit: Payer: Self-pay | Admitting: Women's Health

## 2017-08-13 DIAGNOSIS — Z1231 Encounter for screening mammogram for malignant neoplasm of breast: Secondary | ICD-10-CM

## 2017-09-27 ENCOUNTER — Ambulatory Visit
Admission: RE | Admit: 2017-09-27 | Discharge: 2017-09-27 | Disposition: A | Discharge status: Home / Self Care | Payer: BLUE CROSS/BLUE SHIELD | Source: Ambulatory Visit | Attending: Women's Health | Admitting: Women's Health

## 2017-09-27 DIAGNOSIS — Z1231 Encounter for screening mammogram for malignant neoplasm of breast: Secondary | ICD-10-CM

## 2018-01-17 ENCOUNTER — Ambulatory Visit: Payer: 59 | Admitting: Obstetrics & Gynecology

## 2018-01-17 ENCOUNTER — Encounter: Payer: Self-pay | Admitting: Obstetrics & Gynecology

## 2018-01-17 VITALS — BP 112/70 | Ht 63.5 in | Wt 134.0 lb

## 2018-01-17 DIAGNOSIS — Z01419 Encounter for gynecological examination (general) (routine) without abnormal findings: Secondary | ICD-10-CM | POA: Diagnosis not present

## 2018-01-17 DIAGNOSIS — Z3041 Encounter for surveillance of contraceptive pills: Secondary | ICD-10-CM | POA: Diagnosis not present

## 2018-01-17 MED ORDER — NORETHIN ACE-ETH ESTRAD-FE 1-20 MG-MCG PO TABS: 1.0000 | ORAL_TABLET | Freq: Every day | ORAL | 4 refills | Status: DC

## 2018-01-17 NOTE — Addendum Note (Signed)
Addended by: Berna Spare A on: 01/17/2018 11:14 AM   Modules accepted: Orders

## 2018-01-17 NOTE — Progress Notes (Signed)
Natasha Clark 07-12-1976 [id number]   History:    42 y.o. G0 Stable boyfriend.  Patient is a Education officer, community.  RP:  Established patient presenting for annual gyn exam   HPI: Well on Junel Fe 1/20.  No breakthrough bleeding.  No pelvic pain.  Normal vaginal secretions.  No pain with intercourse.  Urine and bowel movements normal.  Breasts normal.  Body mass index 23.36.  Patient walks on the treadmill or walks her dog regularly.  Health labs with family physician.  Past medical history,surgical history, family history and social history were all reviewed and documented in the EPIC chart.  Gynecologic History Patient's last menstrual period was 12/27/2017. Contraception: OCP (estrogen/progesterone) Last Pap: 12/2015. Results were: Negative.  H/O CO2 Laser of the cervix for CIN 1 in 2008, Pap tests normal since and HPV HR neg in 2015. Last mammogram: 09/2017. Results were: Negative Bone Density: Never Colonoscopy: Never  Obstetric History OB History  Gravida Para Term Preterm AB Living  0            SAB TAB Ectopic Multiple Live Births                ROS: A ROS was performed and pertinent positives and negatives are included in the history.  GENERAL: No fevers or chills. HEENT: No change in vision, no earache, sore throat or sinus congestion. NECK: No pain or stiffness. CARDIOVASCULAR: No chest pain or pressure. No palpitations. PULMONARY: No shortness of breath, cough or wheeze. GASTROINTESTINAL: No abdominal pain, nausea, vomiting or diarrhea, melena or bright red blood per rectum. GENITOURINARY: No urinary frequency, urgency, hesitancy or dysuria. MUSCULOSKELETAL: No joint or muscle pain, no back pain, no recent trauma. DERMATOLOGIC: No rash, no itching, no lesions. ENDOCRINE: No polyuria, polydipsia, no heat or cold intolerance. No recent change in weight. HEMATOLOGICAL: No anemia or easy bruising or bleeding. NEUROLOGIC: No headache, seizures, numbness, tingling or weakness. PSYCHIATRIC: No  depression, no loss of interest in normal activity or change in sleep pattern.     Exam:   BP 112/70   Ht 5' 3.5" (1.613 m)   Wt 134 lb (60.8 kg)   LMP 12/27/2017 Comment: pill   BMI 23.36 kg/m   Body mass index is 23.36 kg/m.  General appearance : Well developed well nourished female. No acute distress HEENT: Eyes: no retinal hemorrhage or exudates,  Neck supple, trachea midline, no carotid bruits, no thyroidmegaly Lungs: Clear to auscultation, no rhonchi or wheezes, or rib retractions  Heart: Regular rate and rhythm, no murmurs or gallops Breast:Examined in sitting and supine position were symmetrical in appearance, no palpable masses or tenderness,  no skin retraction, no nipple inversion, no nipple discharge, no skin discoloration, no axillary or supraclavicular lymphadenopathy Abdomen: no palpable masses or tenderness, no rebound or guarding Extremities: no edema or skin discoloration or tenderness  Pelvic: Vulva: Normal             Vagina: No gross lesions or discharge  Cervix: No gross lesions or discharge.  Pap reflex done.  Uterus  AV, normal size, shape and consistency, non-tender and mobile  Adnexa  Without masses or tenderness  Anus: Normal   Assessment/Plan:  42 y.o. female for annual exam   1. Encounter for routine gynecological examination with Papanicolaou smear of cervix Normal gynecologic exam.  Pap reflex done today.  History of CIN-1 post CO2 laser in 2008.  Pap tests normal since.  High risk HPV negative in 2015.  Breast exam normal.  Screening mammogram was negative in January 2019.  Fasting health labs with family physician.  Body mass index good at 23.36.  Continue with fitness and healthy nutrition.  2. Encounter for surveillance of contraceptive pills Well on birth control pills.  No contraindication.  Same pill represcribed. - norethindrone-ethinyl estradiol (JUNEL FE,GILDESS FE,LOESTRIN FE) 1-20 MG-MCG tablet; Take 1 tablet by mouth  daily.   Genia Del MD, 8:32 AM 01/17/2018

## 2018-01-17 NOTE — Patient Instructions (Signed)
1. Encounter for routine gynecological examination with Papanicolaou smear of cervix Normal gynecologic exam.  Pap reflex done today.  History of CIN-1 post CO2 laser in 2008.  Pap tests normal since.  High risk HPV negative in 2015.  Breast exam normal.  Screening mammogram was negative in January 2019.  Fasting health labs with family physician.  Body mass index good at 23.36.  Continue with fitness and healthy nutrition.  2. Encounter for surveillance of contraceptive pills Well on birth control pills.  No contraindication.  Same pill represcribed. - norethindrone-ethinyl estradiol (JUNEL FE,GILDESS FE,LOESTRIN FE) 1-20 MG-MCG tablet; Take 1 tablet by mouth daily.   Natasha Clark, it was a pleasure meeting you today!  I will inform you of your results as soon as they are available.

## 2018-01-20 LAB — PAP IG W/ RFLX HPV ASCU

## 2019-01-20 ENCOUNTER — Other Ambulatory Visit: Payer: Self-pay

## 2019-01-22 ENCOUNTER — Encounter: Payer: Self-pay | Admitting: Obstetrics & Gynecology

## 2019-01-22 ENCOUNTER — Ambulatory Visit (INDEPENDENT_AMBULATORY_CARE_PROVIDER_SITE_OTHER): Payer: 59 | Admitting: Obstetrics & Gynecology

## 2019-01-22 ENCOUNTER — Other Ambulatory Visit: Payer: Self-pay

## 2019-01-22 VITALS — BP 104/70 | Ht 63.0 in | Wt 133.0 lb

## 2019-01-22 DIAGNOSIS — Z01419 Encounter for gynecological examination (general) (routine) without abnormal findings: Secondary | ICD-10-CM

## 2019-01-22 DIAGNOSIS — Z3041 Encounter for surveillance of contraceptive pills: Secondary | ICD-10-CM | POA: Diagnosis not present

## 2019-01-22 MED ORDER — NORETHIN ACE-ETH ESTRAD-FE 1-20 MG-MCG PO TABS: 1.0000 | ORAL_TABLET | Freq: Every day | ORAL | 4 refills | Status: DC

## 2019-01-22 NOTE — Progress Notes (Signed)
Natasha Clark Jan 31, 1976 [id number]   History:    43 y.o. G0 Married.  Dentist.  RP:  Established patient presenting for annual gyn exam   HPI: Well on BCPs with the generic of LoEstrin Fe 1/20.  No breakthrough bleeding.  No pelvic pain.  Normal vaginal secretions.  No pain with intercourse.  Urine and bowel movements normal.  Breast normal.  Body mass index 23.56.  Good fitness and healthy nutrition.  Fasting health labs here.  Past medical history,surgical history, family history and social history were all reviewed and documented in the EPIC chart.  Gynecologic History Patient's last menstrual period was 01/06/2019. Contraception: OCP (estrogen/progesterone) Last Pap: 01/2018. Results were: Negative Last mammogram: 09/2017. Results were: Negative Bone Density: Never Colonoscopy: Never  Obstetric History OB History  Gravida Para Term Preterm AB Living  0            SAB TAB Ectopic Multiple Live Births                ROS: A ROS was performed and pertinent positives and negatives are included in the history.  GENERAL: No fevers or chills. HEENT: No change in vision, no earache, sore throat or sinus congestion. NECK: No pain or stiffness. CARDIOVASCULAR: No chest pain or pressure. No palpitations. PULMONARY: No shortness of breath, cough or wheeze. GASTROINTESTINAL: No abdominal pain, nausea, vomiting or diarrhea, melena or bright red blood per rectum. GENITOURINARY: No urinary frequency, urgency, hesitancy or dysuria. MUSCULOSKELETAL: No joint or muscle pain, no back pain, no recent trauma. DERMATOLOGIC: No rash, no itching, no lesions. ENDOCRINE: No polyuria, polydipsia, no heat or cold intolerance. No recent change in weight. HEMATOLOGICAL: No anemia or easy bruising or bleeding. NEUROLOGIC: No headache, seizures, numbness, tingling or weakness. PSYCHIATRIC: No depression, no loss of interest in normal activity or change in sleep pattern.     Exam:   BP 104/70   Ht '5\' 3"'   (1.6 m)   Wt 133 lb (60.3 kg)   LMP 01/06/2019 Comment: pill  BMI 23.56 kg/m   Body mass index is 23.56 kg/m.  General appearance : Well developed well nourished female. No acute distress HEENT: Eyes: no retinal hemorrhage or exudates,  Neck supple, trachea midline, no carotid bruits, no thyroidmegaly Lungs: Clear to auscultation, no rhonchi or wheezes, or rib retractions  Heart: Regular rate and rhythm, no murmurs or gallops Breast:Examined in sitting and supine position were symmetrical in appearance, no palpable masses or tenderness,  no skin retraction, no nipple inversion, no nipple discharge, no skin discoloration, no axillary or supraclavicular lymphadenopathy Abdomen: no palpable masses or tenderness, no rebound or guarding Extremities: no edema or skin discoloration or tenderness  Pelvic: Vulva: Normal             Vagina: No gross lesions or discharge  Cervix: No gross lesions or discharge  Uterus  AV, normal size, shape and consistency, non-tender and mobile  Adnexa  Without masses or tenderness  Anus: Normal   Assessment/Plan:  43 y.o. female for annual exam   1. Well female exam with routine gynecological exam Normal gynecologic exam.  Pap test negative in May 2019, no indication to repeat this year.  Breast exam normal.  Patient will schedule a screening mammogram now.  Body mass index normal at 23.56.  We will continue with fitness and healthy nutrition.  Fasting health labs here. - CBC - Comp Met (CMET) - TSH - Lipid panel - VITAMIN D 25 Hydroxy (Vit-D Deficiency, Fractures)  2. Encounter for surveillance of contraceptive pills Well on birth control pills.  No contraindication to continue.  Birth control pills represcribed. - norethindrone-ethinyl estradiol (LOESTRIN FE) 1-20 MG-MCG tablet; Take 1 tablet by mouth daily.  Princess Bruins MD, 9:42 AM 01/22/2019

## 2019-01-23 ENCOUNTER — Encounter: Payer: 59 | Admitting: Obstetrics & Gynecology

## 2019-01-23 ENCOUNTER — Encounter: Payer: Self-pay | Admitting: Obstetrics & Gynecology

## 2019-01-23 LAB — COMPREHENSIVE METABOLIC PANEL
AG Ratio: 1.8 (calc) (ref 1.0–2.5)
ALT: 13 U/L (ref 6–29)
AST: 15 U/L (ref 10–30)
Albumin: 4 g/dL (ref 3.6–5.1)
Alkaline phosphatase (APISO): 41 U/L (ref 31–125)
BUN: 14 mg/dL (ref 7–25)
CO2: 25 mmol/L (ref 20–32)
Calcium: 9.2 mg/dL (ref 8.6–10.2)
Chloride: 108 mmol/L (ref 98–110)
Creat: 0.89 mg/dL (ref 0.50–1.10)
Globulin: 2.2 g/dL (calc) (ref 1.9–3.7)
Glucose, Bld: 87 mg/dL (ref 65–99)
Potassium: 4.5 mmol/L (ref 3.5–5.3)
Sodium: 138 mmol/L (ref 135–146)
Total Bilirubin: 0.5 mg/dL (ref 0.2–1.2)
Total Protein: 6.2 g/dL (ref 6.1–8.1)

## 2019-01-23 LAB — LIPID PANEL
Cholesterol: 185 mg/dL (ref <200)
HDL: 82 mg/dL (ref >=50)
LDL Cholesterol (Calc): 89 mg/dL (calc)
Non-HDL Cholesterol (Calc): 103 mg/dL (calc) (ref <130)
Total CHOL/HDL Ratio: 2.3 (calc) (ref <5.0)
Triglycerides: 53 mg/dL (ref <150)

## 2019-01-23 LAB — CBC
HCT: 35.7 % (ref 35.0–45.0)
Hemoglobin: 12.3 g/dL (ref 11.7–15.5)
MCH: 33.2 pg — ABNORMAL HIGH (ref 27.0–33.0)
MCHC: 34.5 g/dL (ref 32.0–36.0)
MCV: 96.5 fL (ref 80.0–100.0)
MPV: 10.5 fL (ref 7.5–12.5)
Platelets: 256 10*3/uL (ref 140–400)
RBC: 3.7 10*6/uL — ABNORMAL LOW (ref 3.80–5.10)
RDW: 12 % (ref 11.0–15.0)
WBC: 5.2 10*3/uL (ref 3.8–10.8)

## 2019-01-23 LAB — TSH: TSH: 1.95 mIU/L

## 2019-01-23 LAB — VITAMIN D 25 HYDROXY (VIT D DEFICIENCY, FRACTURES): Vit D, 25-Hydroxy: 67 ng/mL (ref 30–100)

## 2019-01-23 NOTE — Patient Instructions (Signed)
1. Well female exam with routine gynecological exam Normal gynecologic exam.  Pap test negative in May 2019, no indication to repeat this year.  Breast exam normal.  Patient will schedule a screening mammogram now.  Body mass index normal at 23.56.  We will continue with fitness and healthy nutrition.  Fasting health labs here. - CBC - Comp Met (CMET) - TSH - Lipid panel - VITAMIN D 25 Hydroxy (Vit-D Deficiency, Fractures)  2. Encounter for surveillance of contraceptive pills Well on birth control pills.  No contraindication to continue.  Birth control pills represcribed. - norethindrone-ethinyl estradiol (LOESTRIN FE) 1-20 MG-MCG tablet; Take 1 tablet by mouth daily.  Natasha Clark, it was a pleasure seeing you today!  I will inform you of your results as soon as they are available.

## 2019-05-29 ENCOUNTER — Other Ambulatory Visit: Payer: Self-pay | Admitting: Women's Health

## 2019-05-29 ENCOUNTER — Other Ambulatory Visit: Payer: Self-pay | Admitting: Obstetrics & Gynecology

## 2019-05-29 DIAGNOSIS — Z1231 Encounter for screening mammogram for malignant neoplasm of breast: Secondary | ICD-10-CM

## 2019-07-17 ENCOUNTER — Other Ambulatory Visit: Payer: Self-pay

## 2019-07-17 ENCOUNTER — Ambulatory Visit
Admission: RE | Admit: 2019-07-17 | Discharge: 2019-07-17 | Disposition: A | Discharge status: Home / Self Care | Payer: BLUE CROSS/BLUE SHIELD | Source: Ambulatory Visit | Attending: Obstetrics & Gynecology | Admitting: Obstetrics & Gynecology

## 2019-07-17 DIAGNOSIS — Z1231 Encounter for screening mammogram for malignant neoplasm of breast: Secondary | ICD-10-CM

## 2020-01-26 ENCOUNTER — Other Ambulatory Visit: Payer: Self-pay | Admitting: Obstetrics & Gynecology

## 2020-01-26 DIAGNOSIS — Z3041 Encounter for surveillance of contraceptive pills: Secondary | ICD-10-CM

## 2020-01-29 ENCOUNTER — Encounter: Payer: 59 | Admitting: Obstetrics & Gynecology

## 2020-02-18 ENCOUNTER — Other Ambulatory Visit: Payer: Self-pay

## 2020-02-19 ENCOUNTER — Encounter: Payer: Self-pay | Admitting: Obstetrics & Gynecology

## 2020-02-19 ENCOUNTER — Ambulatory Visit (INDEPENDENT_AMBULATORY_CARE_PROVIDER_SITE_OTHER): Payer: 59 | Admitting: Obstetrics & Gynecology

## 2020-02-19 VITALS — BP 120/78 | Ht 63.0 in | Wt 134.8 lb

## 2020-02-19 DIAGNOSIS — Z01419 Encounter for gynecological examination (general) (routine) without abnormal findings: Secondary | ICD-10-CM | POA: Diagnosis not present

## 2020-02-19 DIAGNOSIS — Z3041 Encounter for surveillance of contraceptive pills: Secondary | ICD-10-CM

## 2020-02-19 MED ORDER — NORETHIN ACE-ETH ESTRAD-FE 1-20 MG-MCG PO TABS: 1.0000 | ORAL_TABLET | Freq: Every day | ORAL | 4 refills | Status: DC

## 2020-02-19 NOTE — Patient Instructions (Signed)
1. Well female exam with routine gynecological exam Normal gynecologic exam.  Pap test May 2019 was negative, will repeat Pap test at 3 years.  Breast exam normal. Screening mammogram October 2020 was negative.  Good body mass index of 23.88.  Continue with fitness and healthy nutrition.  Full health labs were normal May 2020, will repeat next year.  2. Encounter for surveillance of contraceptive pills Well on low dosage birth control pill.  No contraindication to continue.  Prescription sent to pharmacy. - norethindrone-ethinyl estradiol (AUROVELA FE 1/20) 1-20 MG-MCG tablet; Take 1 tablet by mouth daily.  Natasha Clark, it was a pleasure seeing you today!

## 2020-02-19 NOTE — Progress Notes (Signed)
Natasha Clark 01/11/1976 [id number]   History:    44 y.o. . G0 Married.  Dentist.  RP:  Established patient presenting for annual gyn exam   HPI: Well on BCPs with the generic of LoEstrin Fe 1/20.  No breakthrough bleeding.  No pelvic pain.  Normal vaginal secretions.  No pain with intercourse.  Urine and bowel movements normal.  Breast normal.  Body mass index 23.88. Good fitness and healthy nutrition.  Fasting health labs here 01/2019 completely normal.  Past medical history,surgical history, family history and social history were all reviewed and documented in the EPIC chart.  Gynecologic History No LMP recorded. (Menstrual status: Oral contraceptives).  Obstetric History OB History  Gravida Para Term Preterm AB Living  0            SAB TAB Ectopic Multiple Live Births                ROS: A ROS was performed and pertinent positives and negatives are included in the history.  GENERAL: No fevers or chills. HEENT: No change in vision, no earache, sore throat or sinus congestion. NECK: No pain or stiffness. CARDIOVASCULAR: No chest pain or pressure. No palpitations. PULMONARY: No shortness of breath, cough or wheeze. GASTROINTESTINAL: No abdominal pain, nausea, vomiting or diarrhea, melena or bright red blood per rectum. GENITOURINARY: No urinary frequency, urgency, hesitancy or dysuria. MUSCULOSKELETAL: No joint or muscle pain, no back pain, no recent trauma. DERMATOLOGIC: No rash, no itching, no lesions. ENDOCRINE: No polyuria, polydipsia, no heat or cold intolerance. No recent change in weight. HEMATOLOGICAL: No anemia or easy bruising or bleeding. NEUROLOGIC: No headache, seizures, numbness, tingling or weakness. PSYCHIATRIC: No depression, no loss of interest in normal activity or change in sleep pattern.     Exam:   BP 120/78   Ht 5\' 3"  (1.6 m)   Wt 134 lb 12.8 oz (61.1 kg)   BMI 23.88 kg/m   Body mass index is 23.88 kg/m.  General appearance : Well developed well  nourished female. No acute distress HEENT: Eyes: no retinal hemorrhage or exudates,  Neck supple, trachea midline, no carotid bruits, no thyroidmegaly Lungs: Clear to auscultation, no rhonchi or wheezes, or rib retractions  Heart: Regular rate and rhythm, no murmurs or gallops Breast:Examined in sitting and supine position were symmetrical in appearance, no palpable masses or tenderness,  no skin retraction, no nipple inversion, no nipple discharge, no skin discoloration, no axillary or supraclavicular lymphadenopathy Abdomen: no palpable masses or tenderness, no rebound or guarding Extremities: no edema or skin discoloration or tenderness  Pelvic: Vulva: Normal             Vagina: No gross lesions or discharge  Cervix: No gross lesions or discharge  Uterus  AV, normal size, shape and consistency, non-tender and mobile  Adnexa  Without masses or tenderness  Anus: Normal   Assessment/Plan:  44 y.o. female for annual exam   1. Well female exam with routine gynecological exam Normal gynecologic exam.  Pap test May 2019 was negative, will repeat Pap test at 3 years.  Breast exam normal. Screening mammogram October 2020 was negative.  Good body mass index of 23.88.  Continue with fitness and healthy nutrition.  Full health labs were normal May 2020, will repeat next year.  2. Encounter for surveillance of contraceptive pills Well on low dosage birth control pill.  No contraindication to continue.  Prescription sent to pharmacy. - norethindrone-ethinyl estradiol (AUROVELA FE 1/20) 1-20 MG-MCG tablet;  Take 1 tablet by mouth daily.  Genia Del MD, 11:36 AM 02/19/2020

## 2020-10-31 ENCOUNTER — Other Ambulatory Visit: Payer: Self-pay | Admitting: Obstetrics & Gynecology

## 2020-10-31 DIAGNOSIS — Z1231 Encounter for screening mammogram for malignant neoplasm of breast: Secondary | ICD-10-CM

## 2020-12-23 ENCOUNTER — Inpatient Hospital Stay: Admission: RE | Admit: 2020-12-23 | Payer: 59 | Source: Ambulatory Visit

## 2021-02-17 ENCOUNTER — Ambulatory Visit
Admission: RE | Admit: 2021-02-17 | Discharge: 2021-02-17 | Disposition: A | Discharge status: Home / Self Care | Payer: 59 | Source: Ambulatory Visit | Attending: Obstetrics & Gynecology | Admitting: Obstetrics & Gynecology

## 2021-02-17 ENCOUNTER — Other Ambulatory Visit: Payer: Self-pay

## 2021-02-17 DIAGNOSIS — Z1231 Encounter for screening mammogram for malignant neoplasm of breast: Secondary | ICD-10-CM

## 2021-02-22 ENCOUNTER — Other Ambulatory Visit: Payer: Self-pay | Admitting: Obstetrics & Gynecology

## 2021-02-22 DIAGNOSIS — R928 Other abnormal and inconclusive findings on diagnostic imaging of breast: Secondary | ICD-10-CM

## 2021-02-24 ENCOUNTER — Other Ambulatory Visit (HOSPITAL_COMMUNITY)
Admission: RE | Admit: 2021-02-24 | Discharge: 2021-02-24 | Disposition: A | Discharge status: Home / Self Care | Payer: 59 | Source: Ambulatory Visit | Attending: Obstetrics & Gynecology | Admitting: Obstetrics & Gynecology

## 2021-02-24 ENCOUNTER — Encounter: Payer: Self-pay | Admitting: Obstetrics & Gynecology

## 2021-02-24 ENCOUNTER — Other Ambulatory Visit: Payer: Self-pay

## 2021-02-24 ENCOUNTER — Ambulatory Visit (INDEPENDENT_AMBULATORY_CARE_PROVIDER_SITE_OTHER): Payer: 59 | Admitting: Obstetrics & Gynecology

## 2021-02-24 VITALS — BP 122/70 | Ht 63.0 in | Wt 138.0 lb

## 2021-02-24 DIAGNOSIS — Z01419 Encounter for gynecological examination (general) (routine) without abnormal findings: Secondary | ICD-10-CM | POA: Diagnosis present

## 2021-02-24 DIAGNOSIS — Z3041 Encounter for surveillance of contraceptive pills: Secondary | ICD-10-CM

## 2021-02-24 MED ORDER — NORETHIN ACE-ETH ESTRAD-FE 1-20 MG-MCG PO TABS: 1.0000 | ORAL_TABLET | Freq: Every day | ORAL | 4 refills | Status: DC

## 2021-02-24 NOTE — Progress Notes (Signed)
Natasha Clark 04/21/1976 [id number]   History:    45 y.o. G0 Married.  Dentist.   RP:  Established patient presenting for annual gyn exam   HPI: Well on BCPs with the generic of LoEstrin Fe 1/20.  No breakthrough bleeding.  No pelvic pain.  Normal vaginal secretions.  No pain with intercourse.  Last pap 2019.  Urine and bowel movements normal.  Breast normal.  Screening mammo Left with distortion, Lt Dx mammo/US scheduled next week.  Body mass index 24.45. Good fitness and healthy nutrition.  Fasting health labs here 01/2019 completely normal.   Past medical history,surgical history, family history and social history were all reviewed and documented in the EPIC chart.  Gynecologic History Patient's last menstrual period was 02/10/2021.  Obstetric History OB History  Gravida Para Term Preterm AB Living  0            SAB IAB Ectopic Multiple Live Births                ROS: A ROS was performed and pertinent positives and negatives are included in the history.  GENERAL: No fevers or chills. HEENT: No change in vision, no earache, sore throat or sinus congestion. NECK: No pain or stiffness. CARDIOVASCULAR: No chest pain or pressure. No palpitations. PULMONARY: No shortness of breath, cough or wheeze. GASTROINTESTINAL: No abdominal pain, nausea, vomiting or diarrhea, melena or bright red blood per rectum. GENITOURINARY: No urinary frequency, urgency, hesitancy or dysuria. MUSCULOSKELETAL: No joint or muscle pain, no back pain, no recent trauma. DERMATOLOGIC: No rash, no itching, no lesions. ENDOCRINE: No polyuria, polydipsia, no heat or cold intolerance. No recent change in weight. HEMATOLOGICAL: No anemia or easy bruising or bleeding. NEUROLOGIC: No headache, seizures, numbness, tingling or weakness. PSYCHIATRIC: No depression, no loss of interest in normal activity or change in sleep pattern.     Exam:   BP 122/70 (BP Location: Right Arm, Patient Position: Sitting, Cuff Size: Small)    Ht _0  (1.6 m)   Wt 138 lb (62.6 kg)   LMP 02/10/2021   BMI 24.45 kg/m   Body mass index is 24.45 kg/m.  General appearance : Well developed well nourished female. No acute distress HEENT: Eyes: no retinal hemorrhage or exudates,  Neck supple, trachea midline, no carotid bruits, no thyroidmegaly Lungs: Clear to auscultation, no rhonchi or wheezes, or rib retractions  Heart: Regular rate and rhythm, no murmurs or gallops Breast:Examined in sitting and supine position were symmetrical in appearance, no palpable masses or tenderness,  no skin retraction, no nipple inversion, no nipple discharge, no skin discoloration, no axillary or supraclavicular lymphadenopathy Abdomen: no palpable masses or tenderness, no rebound or guarding Extremities: no edema or skin discoloration or tenderness  Pelvic: Vulva: Normal             Vagina: No gross lesions or discharge  Cervix: No gross lesions or discharge.  Pap reflex done  Uterus  AV, normal size, shape and consistency, non-tender and mobile  Adnexa  Without masses or tenderness  Anus: Normal   Assessment/Plan:  45 y.o. female for annual exam   1. Encounter for routine gynecological examination with Papanicolaou smear of cervix Normal gynecologic exam.  Pap reflex done.  Breast exam normal.  Screening mammogram February 17, 2021 showed a distortion on the left breast.  Left diagnostic mammogram with ultrasound planned for next week.  Will do fasting health labs here today.  Good body mass index at 24.45.  Will resume  fitness, continue with healthy nutrition. - Cytology - PAP( [place]) - CBC - Comp Met (CMET) - Lipid Profile - TSH - Vitamin D 1,25 dihydroxy  2. Encounter for surveillance of contraceptive pills Well on the birth control pill with norethindrone ethynyl estradiol FE 1/20.  No contraindication to continue.  Prescription sent to pharmacy. - norethindrone-ethinyl estradiol-FE (AUROVELA FE 1/20) 1-20 MG-MCG tablet; Take 1 tablet  by mouth daily.   Princess Bruins MD, 9:41 AM 02/24/2021

## 2021-02-27 LAB — CYTOLOGY - PAP: Diagnosis: NEGATIVE

## 2021-02-28 LAB — COMPREHENSIVE METABOLIC PANEL
AG Ratio: 1.7 (calc) (ref 1.0–2.5)
ALT: 10 U/L (ref 6–29)
AST: 13 U/L (ref 10–35)
Albumin: 4 g/dL (ref 3.6–5.1)
Alkaline phosphatase (APISO): 66 U/L (ref 31–125)
BUN: 13 mg/dL (ref 7–25)
CO2: 24 mmol/L (ref 20–32)
Calcium: 9.1 mg/dL (ref 8.6–10.2)
Chloride: 105 mmol/L (ref 98–110)
Creat: 0.87 mg/dL (ref 0.50–1.10)
Globulin: 2.4 g/dL (calc) (ref 1.9–3.7)
Glucose, Bld: 89 mg/dL (ref 65–99)
Potassium: 4.1 mmol/L (ref 3.5–5.3)
Sodium: 138 mmol/L (ref 135–146)
Total Bilirubin: 0.4 mg/dL (ref 0.2–1.2)
Total Protein: 6.4 g/dL (ref 6.1–8.1)

## 2021-02-28 LAB — VITAMIN D 1,25 DIHYDROXY
Vitamin D 1, 25 (OH)2 Total: 74 pg/mL — ABNORMAL HIGH (ref 18–72)
Vitamin D2 1, 25 (OH)2: <8 pg/mL
Vitamin D3 1, 25 (OH)2: 74 pg/mL

## 2021-02-28 LAB — CBC
HCT: 38.5 % (ref 35.0–45.0)
Hemoglobin: 13 g/dL (ref 11.7–15.5)
MCH: 32.2 pg (ref 27.0–33.0)
MCHC: 33.8 g/dL (ref 32.0–36.0)
MCV: 95.3 fL (ref 80.0–100.0)
MPV: 9.8 fL (ref 7.5–12.5)
Platelets: 283 10*3/uL (ref 140–400)
RBC: 4.04 10*6/uL (ref 3.80–5.10)
RDW: 11.5 % (ref 11.0–15.0)
WBC: 6.9 10*3/uL (ref 3.8–10.8)

## 2021-02-28 LAB — LIPID PANEL
Cholesterol: 177 mg/dL (ref <200)
HDL: 82 mg/dL (ref >=50)
LDL Cholesterol (Calc): 81 mg/dL (calc)
Non-HDL Cholesterol (Calc): 95 mg/dL (calc) (ref <130)
Total CHOL/HDL Ratio: 2.2 (calc) (ref <5.0)
Triglycerides: 63 mg/dL (ref <150)

## 2021-02-28 LAB — TSH: TSH: 1.87 mIU/L

## 2021-03-15 ENCOUNTER — Ambulatory Visit
Admission: RE | Admit: 2021-03-15 | Discharge: 2021-03-15 | Disposition: A | Discharge status: Home / Self Care | Payer: 59 | Source: Ambulatory Visit | Attending: Obstetrics & Gynecology | Admitting: Obstetrics & Gynecology

## 2021-03-15 ENCOUNTER — Ambulatory Visit: Payer: 59

## 2021-03-15 ENCOUNTER — Other Ambulatory Visit: Payer: Self-pay

## 2021-03-15 DIAGNOSIS — R928 Other abnormal and inconclusive findings on diagnostic imaging of breast: Secondary | ICD-10-CM

## 2021-04-09 ENCOUNTER — Other Ambulatory Visit: Payer: Self-pay | Admitting: Obstetrics & Gynecology

## 2021-04-09 DIAGNOSIS — Z3041 Encounter for surveillance of contraceptive pills: Secondary | ICD-10-CM

## 2021-04-10 ENCOUNTER — Other Ambulatory Visit: Payer: Self-pay | Admitting: Obstetrics & Gynecology

## 2021-04-10 DIAGNOSIS — Z3041 Encounter for surveillance of contraceptive pills: Secondary | ICD-10-CM

## 2021-10-02 IMAGING — MG MM DIGITAL DIAGNOSTIC UNILAT*L* W/ TOMO W/ CAD
6 series · 6 of 18 positions shown · non-contrast
Comparison: Previous exam(s).

CLINICAL DATA: Possible distortion in the posterior central,
slightly upper left breast in the oblique projection of a recent
screening mammogram. Possible distortion in the posterior outer left
breast in the craniocaudal projection of the recent screening
mammogram.

EXAM:
DIGITAL DIAGNOSTIC UNILATERAL LEFT MAMMOGRAM WITH TOMOSYNTHESIS AND
CAD
TECHNIQUE: Left digital diagnostic mammography and breast tomosynthesis was
performed. The images were evaluated with computer-aided detection.

[L CC synth-2D]
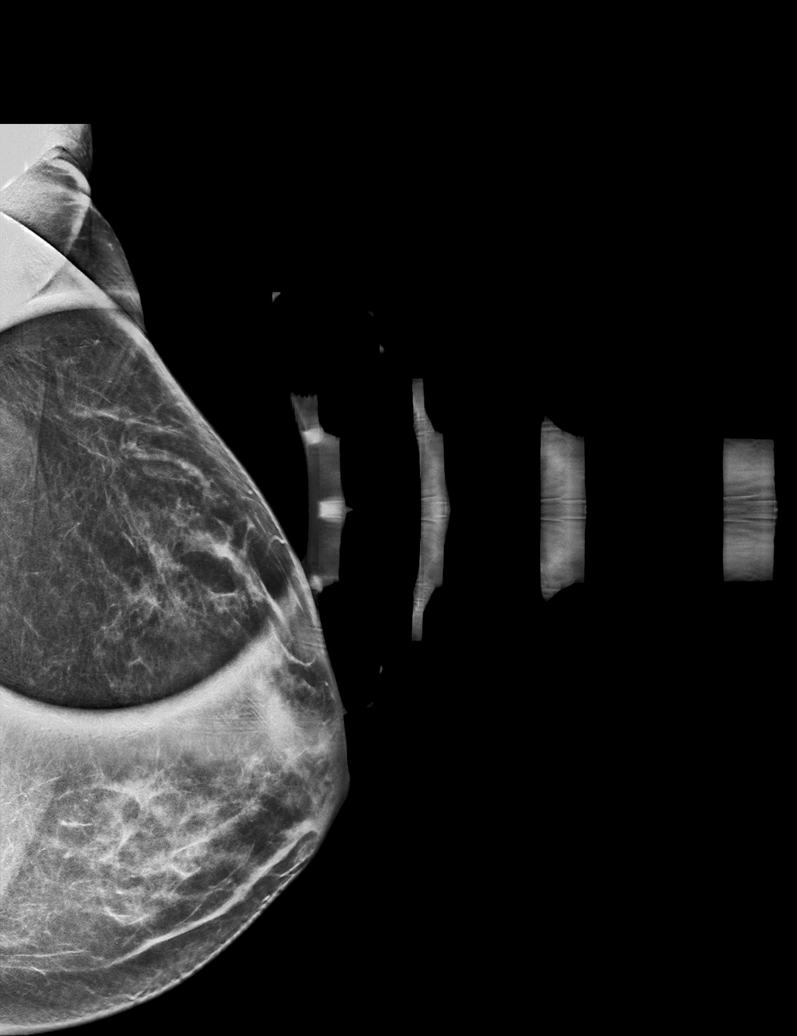

[L ML synth-2D]
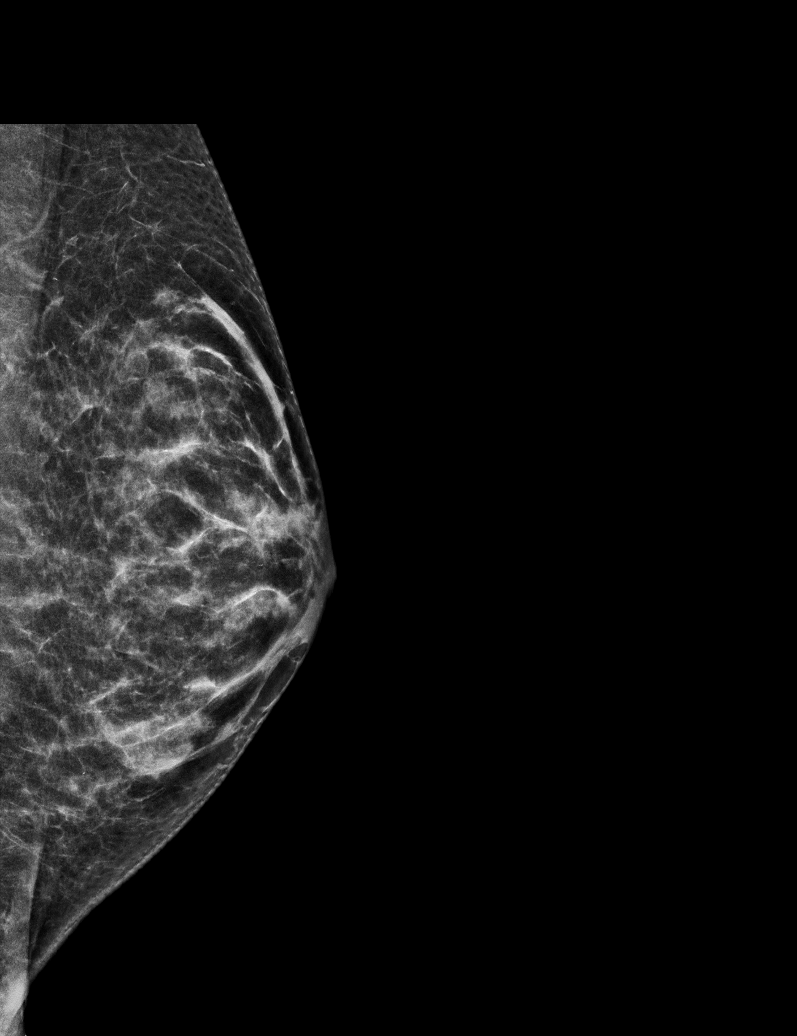

[L MLO synth-2D]
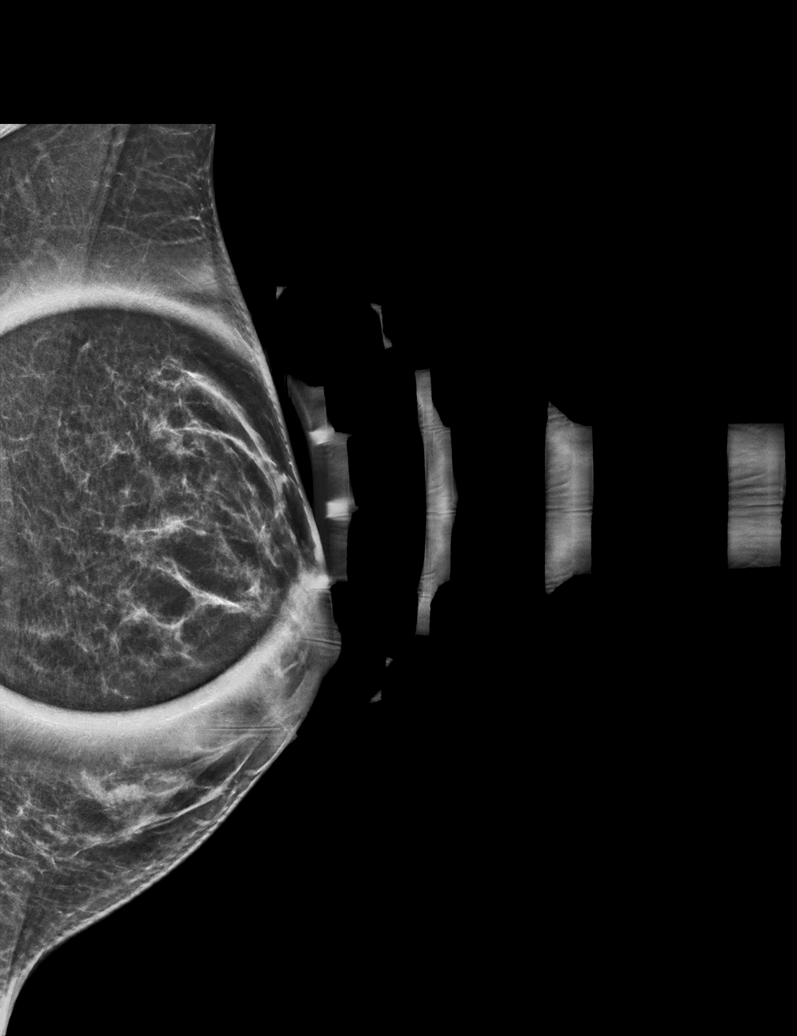

[L CC tomo · tomo slice 27/53.0]
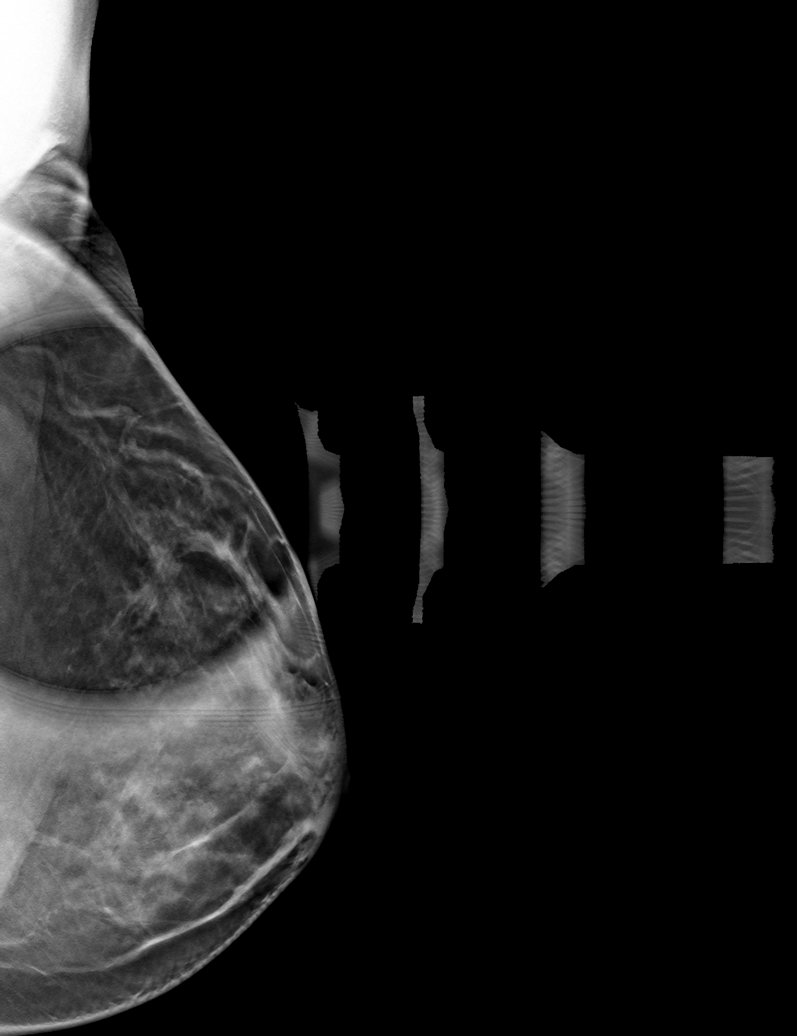

[L ML tomo · tomo slice 25/50.0]
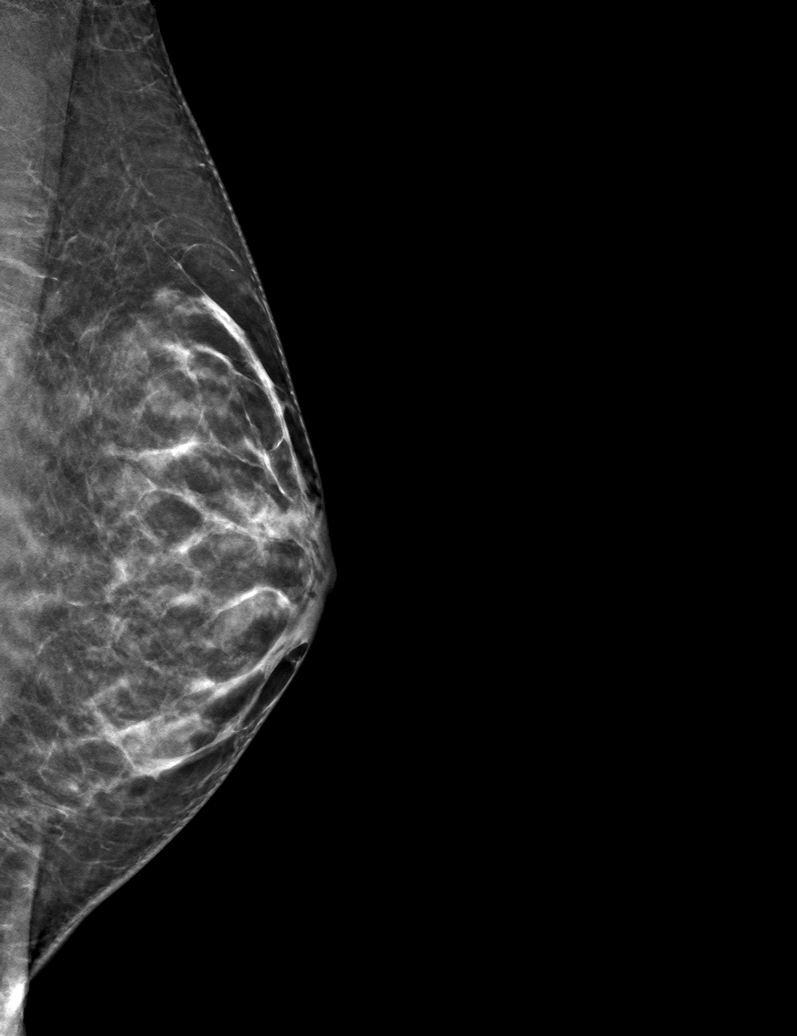

[L MLO tomo · tomo slice 28/55.0]
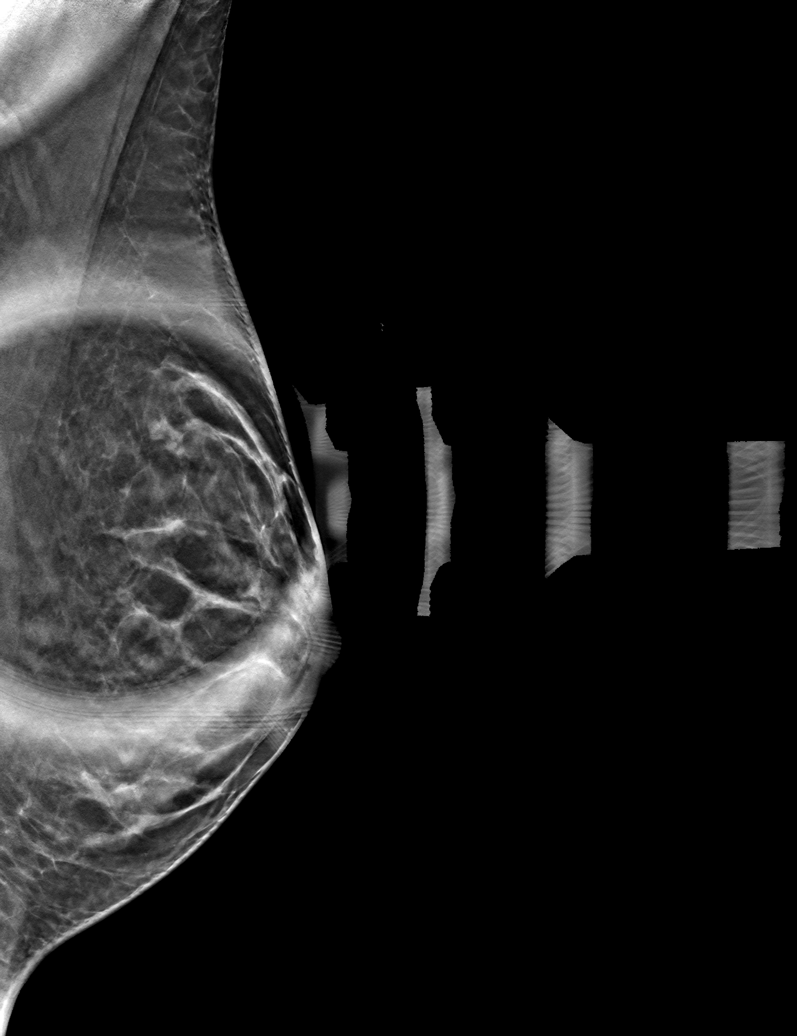

[6 of 18 positions shown; findings below may reference images not displayed]

ACR Breast Density Category c: The breast tissue is heterogeneously
dense, which may obscure small masses.
FINDINGS: 3D tomographic and 2D generated true lateral and spot compression
images of the left breast demonstrate normal appearing
fibroglandular tissue at the locations of the recently suspected
distortion, unchanged compared previous examinations. No interval
findings suspicious for malignancy.
IMPRESSION: No evidence of malignancy. The recently suspected left breast
distortion was close apposition of normal breast tissue.

RECOMMENDATION:
Bilateral screening mammogram in 1 year when due.

I have discussed the findings and recommendations with the patient.
If applicable, a reminder letter will be sent to the patient
regarding the next appointment.

BI-RADS CATEGORY  1: Negative.

## 2022-03-09 ENCOUNTER — Ambulatory Visit: Payer: 59 | Admitting: Obstetrics & Gynecology

## 2022-03-10 ENCOUNTER — Other Ambulatory Visit: Payer: Self-pay | Admitting: Obstetrics & Gynecology

## 2022-03-10 DIAGNOSIS — Z3041 Encounter for surveillance of contraceptive pills: Secondary | ICD-10-CM

## 2022-03-12 ENCOUNTER — Other Ambulatory Visit: Payer: Self-pay | Admitting: Obstetrics & Gynecology

## 2022-03-12 DIAGNOSIS — Z3041 Encounter for surveillance of contraceptive pills: Secondary | ICD-10-CM

## 2022-03-16 ENCOUNTER — Ambulatory Visit: Payer: 59 | Admitting: Obstetrics & Gynecology

## 2022-03-23 ENCOUNTER — Ambulatory Visit (INDEPENDENT_AMBULATORY_CARE_PROVIDER_SITE_OTHER): Payer: 59 | Admitting: Obstetrics & Gynecology

## 2022-03-23 ENCOUNTER — Encounter: Payer: Self-pay | Admitting: Obstetrics & Gynecology

## 2022-03-23 VITALS — BP 110/70 | HR 87 | Ht 63.25 in | Wt 144.0 lb

## 2022-03-23 DIAGNOSIS — Z3041 Encounter for surveillance of contraceptive pills: Secondary | ICD-10-CM | POA: Diagnosis not present

## 2022-03-23 DIAGNOSIS — Z01419 Encounter for gynecological examination (general) (routine) without abnormal findings: Secondary | ICD-10-CM

## 2022-03-23 MED ORDER — NORETHIN ACE-ETH ESTRAD-FE 1-20 MG-MCG PO TABS: 1.0000 | ORAL_TABLET | Freq: Every day | ORAL | 4 refills | Status: DC

## 2022-03-23 NOTE — Progress Notes (Signed)
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  Dentist.   RP:  Established patient presenting for annual gyn exam   HPI: Well on BCPs with the generic of LoEstrin Fe 1/20.  No breakthrough bleeding.  No pelvic pain.  Normal vaginal secretions.  No pain with intercourse.  Last pap Neg 02/24/21. No recent abnormal Pap.  Will repeat Pap at 2-3 yrs.  Urine and bowel movements normal.  Breasts normal.  Screening mammo Rt Neg, Left with distortion 02/24/21, Lt Dx mammo/US 03/15/21.  Body mass index 25.31. Good fitness and healthy nutrition.  Fasting health labs here 02/2021 completely normal.  Will organize a [facility] this year.   Past medical history,surgical history, family history and social history were all reviewed and documented in the EPIC chart.  Gynecologic History Patient's last menstrual period was 03/13/2022 (exact date).  Obstetric History OB History  Gravida Para Term Preterm AB Living  0            SAB IAB Ectopic Multiple Live Births                ROS: A ROS was performed and pertinent positives and negatives are included in the history. GENERAL: No fevers or chills. HEENT: No change in vision, no earache, sore throat or sinus congestion. NECK: No pain or stiffness. CARDIOVASCULAR: No chest pain or pressure. No palpitations. PULMONARY: No shortness of breath, cough or wheeze. GASTROINTESTINAL: No abdominal pain, nausea, vomiting or diarrhea, melena or bright red blood per rectum. GENITOURINARY: No urinary frequency, urgency, hesitancy or dysuria. MUSCULOSKELETAL: No joint or muscle pain, no back pain, no recent trauma. DERMATOLOGIC: No rash, no itching, no lesions. ENDOCRINE: No polyuria, polydipsia, no heat or cold intolerance. No recent change in weight. HEMATOLOGICAL: No anemia or easy bruising or bleeding. NEUROLOGIC: No headache, seizures, numbness, tingling or weakness. PSYCHIATRIC: No depression, no loss of interest in normal activity or change in  sleep pattern.     Exam:   BP 110/70   Pulse 87   Ht 5' 3.25" (1.607 m)   Wt 144 lb (65.3 kg)   LMP 03/13/2022 (Exact Date)   SpO2 99%   BMI 25.31 kg/m   Body mass index is 25.31 kg/m.  General appearance : Well developed well nourished female. No acute distress HEENT: Eyes: no retinal hemorrhage or exudates,  Neck supple, trachea midline, no carotid bruits, no thyroidmegaly Lungs: Clear to auscultation, no rhonchi or wheezes, or rib retractions  Heart: Regular rate and rhythm, no murmurs or gallops Breast:Examined in sitting and supine position were symmetrical in appearance, no palpable masses or tenderness,  no skin retraction, no nipple inversion, no nipple discharge, no skin discoloration, no axillary or supraclavicular lymphadenopathy Abdomen: no palpable masses or tenderness, no rebound or guarding Extremities: no edema or skin discoloration or tenderness  Pelvic: Vulva: Normal             Vagina: No gross lesions or discharge  Cervix: No gross lesions or discharge  Uterus  AV, normal size, shape and consistency, non-tender and mobile  Adnexa  Without masses or tenderness  Anus: Normal   Assessment/Plan:  46 y.o. female for annual exam   1. Well female exam with routine gynecological exam Well on BCPs with the generic of LoEstrin Fe 1/20.  No breakthrough bleeding.  No pelvic pain.  Normal vaginal secretions.  No pain with intercourse.  Last pap Neg 02/24/21. No recent abnormal Pap.  Will  repeat Pap at 2-3 yrs.  Urine and bowel movements normal.  Breasts normal.  Screening mammo Rt Neg, Left with distortion 02/24/21, Lt Dx mammo/US 03/15/21.  Body mass index 25.31. Good fitness and healthy nutrition.  Fasting health labs here 02/2021 completely normal.  Will organize a [facility] this year.  2. Encounter for surveillance of contraceptive pills Well on BCPs with the generic of LoEstrin Fe 1/20.  No breakthrough bleeding.  No pelvic pain.  Normal vaginal secretions.  No pain with  intercourse. No CI to BCPs.  Prescription sent to pharmacy. - norethindrone-ethinyl estradiol-FE (BLISOVI FE 1/20) 1-20 MG-MCG tablet; Take 1 tablet by mouth daily.  Other orders - Multiple Vitamin (MULTIVITAMIN PO); Take by mouth. occ   Genia Del MD, 9:28 AM 03/23/2022

## 2023-02-19 ENCOUNTER — Other Ambulatory Visit: Payer: Self-pay | Admitting: Obstetrics & Gynecology

## 2023-02-19 DIAGNOSIS — Z1231 Encounter for screening mammogram for malignant neoplasm of breast: Secondary | ICD-10-CM

## 2023-03-01 ENCOUNTER — Ambulatory Visit
Admission: RE | Admit: 2023-03-01 | Discharge: 2023-03-01 | Disposition: A | Discharge status: Home / Self Care | Payer: 59 | Source: Ambulatory Visit | Attending: Obstetrics & Gynecology | Admitting: Obstetrics & Gynecology

## 2023-03-01 DIAGNOSIS — Z1231 Encounter for screening mammogram for malignant neoplasm of breast: Secondary | ICD-10-CM

## 2023-03-29 ENCOUNTER — Ambulatory Visit (INDEPENDENT_AMBULATORY_CARE_PROVIDER_SITE_OTHER): Payer: Managed Care, Other (non HMO) | Admitting: Obstetrics & Gynecology

## 2023-03-29 ENCOUNTER — Other Ambulatory Visit (HOSPITAL_COMMUNITY)
Admission: RE | Admit: 2023-03-29 | Discharge: 2023-03-29 | Disposition: A | Discharge status: Home / Self Care | Payer: Managed Care, Other (non HMO) | Source: Ambulatory Visit | Attending: Obstetrics & Gynecology | Admitting: Obstetrics & Gynecology

## 2023-03-29 ENCOUNTER — Encounter: Payer: Self-pay | Admitting: Obstetrics & Gynecology

## 2023-03-29 VITALS — BP 110/72 | HR 80 | Ht 63.25 in | Wt 140.0 lb

## 2023-03-29 DIAGNOSIS — Z01419 Encounter for gynecological examination (general) (routine) without abnormal findings: Secondary | ICD-10-CM

## 2023-03-29 DIAGNOSIS — Z3041 Encounter for surveillance of contraceptive pills: Secondary | ICD-10-CM | POA: Diagnosis not present

## 2023-03-29 LAB — CBC
HCT: 41.2 % (ref 35.0–45.0)
Hemoglobin: 14 g/dL (ref 11.7–15.5)
MCHC: 34 g/dL (ref 32.0–36.0)
Platelets: 252 10*3/uL (ref 140–400)

## 2023-03-29 MED ORDER — NORETHIN ACE-ETH ESTRAD-FE 1-20 MG-MCG PO TABS
1.0000 | ORAL_TABLET | Freq: Every day | ORAL | 4 refills | Status: DC
Start: 2023-03-29 — End: 2023-06-07

## 2023-03-29 NOTE — Progress Notes (Signed)
Natasha Clark 1975-10-01 [id number]   History:    47 y.o. G0 Married.  Dentist.   RP:  Established patient presenting for annual gyn exam   HPI: Well on BCPs with the generic of LoEstrin Fe 1/20.  No breakthrough bleeding.  No pelvic pain.  Normal vaginal secretions.  No pain with intercourse.  Last pap Neg 02/24/21. No recent abnormal Pap.  Pap reflex today. Urine and bowel movements normal.  Breasts normal.  Screening mammo 03/01/23 Negative. Body mass index 24.6. Good fitness and healthy nutrition.  Fasting health labs here today.  Will organize a [facility] this year.   Past medical history,surgical history, family history and social history were all reviewed and documented in the EPIC chart.  Gynecologic History Patient's last menstrual period was 03/15/2023 (exact date).  Obstetric History OB History  Gravida Para Term Preterm AB Living  0            SAB IAB Ectopic Multiple Live Births                ROS: A ROS was performed and pertinent positives and negatives are included in the history. GENERAL: No fevers or chills. HEENT: No change in vision, no earache, sore throat or sinus congestion. NECK: No pain or stiffness. CARDIOVASCULAR: No chest pain or pressure. No palpitations. PULMONARY: No shortness of breath, cough or wheeze. GASTROINTESTINAL: No abdominal pain, nausea, vomiting or diarrhea, melena or bright red blood per rectum. GENITOURINARY: No urinary frequency, urgency, hesitancy or dysuria. MUSCULOSKELETAL: No joint or muscle pain, no back pain, no recent trauma. DERMATOLOGIC: No rash, no itching, no lesions. ENDOCRINE: No polyuria, polydipsia, no heat or cold intolerance. No recent change in weight. HEMATOLOGICAL: No anemia or easy bruising or bleeding. NEUROLOGIC: No headache, seizures, numbness, tingling or weakness. PSYCHIATRIC: No depression, no loss of interest in normal activity or change in sleep pattern.     Exam:   BP 110/72   Pulse 80   Ht 5' 3.25" (1.607 m)    Wt 140 lb (63.5 kg)   LMP 03/15/2023 (Exact Date) Comment: sexually active, ocp's  SpO2 99%   BMI 24.60 kg/m   Body mass index is 24.6 kg/m.  General appearance : Well developed well nourished female. No acute distress HEENT: Eyes: no retinal hemorrhage or exudates,  Neck supple, trachea midline, no carotid bruits, no thyroidmegaly Lungs: Clear to auscultation, no rhonchi or wheezes, or rib retractions  Heart: Regular rate and rhythm, no murmurs or gallops Breast:Examined in sitting and supine position were symmetrical in appearance, no palpable masses or tenderness,  no skin retraction, no nipple inversion, no nipple discharge, no skin discoloration, no axillary or supraclavicular lymphadenopathy Abdomen: no palpable masses or tenderness, no rebound or guarding Extremities: no edema or skin discoloration or tenderness  Pelvic: Vulva: Normal             Vagina: No gross lesions or discharge  Cervix: No gross lesions or discharge.  Pap reflex done.  Uterus  AV, normal size, shape and consistency, non-tender and mobile  Adnexa  Without masses or tenderness  Anus: Normal   Assessment/Plan:  47 y.o. female for annual exam   1. Encounter for routine gynecological examination with Papanicolaou smear of cervix Well on BCPs with the generic of LoEstrin Fe 1/20.  No breakthrough bleeding.  No pelvic pain.  Normal vaginal secretions.  No pain with intercourse.  Last pap Neg 02/24/21. No recent abnormal Pap.  Pap reflex today. Urine and bowel movements  normal.  Breasts normal.  Screening mammo 03/01/23 Negative. Body mass index 24.6. Good fitness and healthy nutrition.  Fasting health labs here today.  Will organize a [facility] this year. - Cytology - PAP( [place]) - CBC - Comp Met (CMET) - TSH - Lipid Profile - Vitamin D (25 hydroxy)  2. Encounter for surveillance of contraceptive pills Well on BCPs with the generic of LoEstrin Fe 1/20.  No breakthrough bleeding.  No pelvic pain.  Normal  vaginal secretions.  No pain with intercourse. No CI to continue on the BCPs.  Prescription sent to pharmacy. - norethindrone-ethinyl estradiol-FE (BLISOVI FE 1/20) 1-20 MG-MCG tablet; Take 1 tablet by mouth daily.   Genia Del MD, 9:28 AM

## 2023-03-30 LAB — COMPREHENSIVE METABOLIC PANEL
AG Ratio: 1.8 (calc) (ref 1.0–2.5)
ALT: 13 U/L (ref 6–29)
AST: 16 U/L (ref 10–35)
Albumin: 4.4 g/dL (ref 3.6–5.1)
Alkaline phosphatase (APISO): 39 U/L (ref 31–125)
BUN: 14 mg/dL (ref 7–25)
CO2: 22 mmol/L (ref 20–32)
Calcium: 9.1 mg/dL (ref 8.6–10.2)
Chloride: 105 mmol/L (ref 98–110)
Creat: 0.91 mg/dL (ref 0.50–0.99)
Globulin: 2.4 g/dL (calc) (ref 1.9–3.7)
Glucose, Bld: 85 mg/dL (ref 65–99)
Potassium: 4.5 mmol/L (ref 3.5–5.3)
Sodium: 138 mmol/L (ref 135–146)
Total Bilirubin: 0.7 mg/dL (ref 0.2–1.2)
Total Protein: 6.8 g/dL (ref 6.1–8.1)

## 2023-03-30 LAB — LIPID PANEL
Cholesterol: 196 mg/dL (ref <200)
HDL: 93 mg/dL (ref >=50)
LDL Cholesterol (Calc): 87 mg/dL (calc)
Non-HDL Cholesterol (Calc): 103 mg/dL (calc) (ref <130)
Total CHOL/HDL Ratio: 2.1 (calc) (ref <5.0)
Triglycerides: 69 mg/dL (ref <150)

## 2023-03-30 LAB — CBC
MCH: 32.5 pg (ref 27.0–33.0)
MCV: 95.6 fL (ref 80.0–100.0)
MPV: 10.4 fL (ref 7.5–12.5)
RBC: 4.31 10*6/uL (ref 3.80–5.10)
RDW: 11.5 % (ref 11.0–15.0)
WBC: 7.1 10*3/uL (ref 3.8–10.8)

## 2023-03-30 LAB — VITAMIN D 25 HYDROXY (VIT D DEFICIENCY, FRACTURES): Vit D, 25-Hydroxy: 64 ng/mL (ref 30–100)

## 2023-03-30 LAB — TSH: TSH: 1.71 mIU/L

## 2023-04-02 LAB — CYTOLOGY - PAP: Diagnosis: NEGATIVE

## 2023-06-07 ENCOUNTER — Other Ambulatory Visit: Payer: Self-pay | Admitting: Obstetrics & Gynecology

## 2023-06-07 DIAGNOSIS — Z3041 Encounter for surveillance of contraceptive pills: Secondary | ICD-10-CM

## 2023-06-07 NOTE — Telephone Encounter (Signed)
Med refill request: Blisovi Fe 1/20 Last AEX: 03/29/23 Pharmacy notified rx was sent 03/29/23 for #[id number] with 4 refills Next AEX:n/a Last MMG (if hormonal med) 03/01/23 Refill sent to provider.

## 2024-02-21 ENCOUNTER — Other Ambulatory Visit: Payer: Self-pay | Admitting: Radiology

## 2024-02-21 DIAGNOSIS — Z1231 Encounter for screening mammogram for malignant neoplasm of breast: Secondary | ICD-10-CM

## 2024-03-13 ENCOUNTER — Ambulatory Visit
Admission: RE | Admit: 2024-03-13 | Discharge: 2024-03-13 | Disposition: A | Discharge status: Home / Self Care | Source: Ambulatory Visit | Attending: Radiology | Admitting: Radiology

## 2024-03-13 DIAGNOSIS — Z1231 Encounter for screening mammogram for malignant neoplasm of breast: Secondary | ICD-10-CM

## 2024-03-18 ENCOUNTER — Other Ambulatory Visit: Payer: Self-pay | Admitting: Radiology

## 2024-03-18 DIAGNOSIS — R928 Other abnormal and inconclusive findings on diagnostic imaging of breast: Secondary | ICD-10-CM

## 2024-04-03 ENCOUNTER — Ambulatory Visit (INDEPENDENT_AMBULATORY_CARE_PROVIDER_SITE_OTHER): Payer: Managed Care, Other (non HMO) | Admitting: Radiology

## 2024-04-03 ENCOUNTER — Encounter: Payer: Self-pay | Admitting: Radiology

## 2024-04-03 VITALS — BP 118/64 | HR 71 | Ht 64.0 in | Wt 141.6 lb

## 2024-04-03 DIAGNOSIS — Z3041 Encounter for surveillance of contraceptive pills: Secondary | ICD-10-CM

## 2024-04-03 DIAGNOSIS — Z01419 Encounter for gynecological examination (general) (routine) without abnormal findings: Secondary | ICD-10-CM | POA: Diagnosis not present

## 2024-04-03 DIAGNOSIS — Z1331 Encounter for screening for depression: Secondary | ICD-10-CM

## 2024-04-03 DIAGNOSIS — Z1211 Encounter for screening for malignant neoplasm of colon: Secondary | ICD-10-CM

## 2024-04-03 MED ORDER — NORETHIN ACE-ETH ESTRAD-FE 1-20 MG-MCG PO TABS: 1.0000 | ORAL_TABLET | Freq: Every day | ORAL | 4 refills | Status: DC

## 2024-04-03 NOTE — Patient Instructions (Signed)
 Preventive Care 16-50 Years Old, Female  Preventive care refers to lifestyle choices and visits with your health care provider that can promote health and wellness. Preventive care visits are also called wellness exams.  What can I expect for my preventive care visit?  Counseling  Your health care provider may ask you questions about your:  Medical history, including:  Past medical problems.  Family medical history.  Pregnancy history.  Current health, including:  Menstrual cycle.  Method of birth control.  Emotional well-being.  Home life and relationship well-being.  Sexual activity and sexual health.  Lifestyle, including:  Alcohol, nicotine or tobacco, and drug use.  Access to firearms.  Diet, exercise, and sleep habits.  Work and work Astronomer.  Sunscreen use.  Safety issues such as seatbelt and bike helmet use.  Physical exam  Your health care provider will check your:  Height and weight. These may be used to calculate your BMI (body mass index). BMI is a measurement that tells if you are at a healthy weight.  Waist circumference. This measures the distance around your waistline. This measurement also tells if you are at a healthy weight and may help predict your risk of certain diseases, such as type 2 diabetes and high blood pressure.  Heart rate and blood pressure.  Body temperature.  Skin for abnormal spots.  What immunizations do I need?    Vaccines are usually given at various ages, according to a schedule. Your health care provider will recommend vaccines for you based on your age, medical history, and lifestyle or other factors, such as travel or where you work.  What tests do I need?  Screening  Your health care provider may recommend screening tests for certain conditions. This may include:  Lipid and cholesterol levels.  Diabetes screening. This is done by checking your blood sugar (glucose) after you have not eaten for a while (fasting).  Pelvic exam and Pap test.  Hepatitis B test.  Hepatitis C  test.  HIV (human immunodeficiency virus) test.  STI (sexually transmitted infection) testing, if you are at risk.  Lung cancer screening.  Colorectal cancer screening.  Mammogram. Talk with your health care provider about when you should start having regular mammograms. This may depend on whether you have a family history of breast cancer.  BRCA-related cancer screening. This may be done if you have a family history of breast, ovarian, tubal, or peritoneal cancers.  Bone density scan. This is done to screen for osteoporosis.  Talk with your health care provider about your test results, treatment options, and if necessary, the need for more tests.  Follow these instructions at home:  Eating and drinking    Eat a diet that includes fresh fruits and vegetables, whole grains, lean protein, and low-fat dairy products.  Take vitamin and mineral supplements as recommended by your health care provider.  Do not drink alcohol if:  Your health care provider tells you not to drink.  You are pregnant, may be pregnant, or are planning to become pregnant.  If you drink alcohol:  Limit how much you have to 0-1 drink a day.  Know how much alcohol is in your drink. In the [place] one drink equals one 12 oz bottle of beer (355 mL), one 5 oz glass of wine (148 mL), or one 1 oz glass of hard liquor (44 mL).  Lifestyle  Brush your teeth every morning and night with fluoride toothpaste. Floss one time each day.  Exercise for at least  30 minutes 5 or more days each week.  Do not use any products that contain nicotine or tobacco. These products include cigarettes, chewing tobacco, and vaping devices, such as e-cigarettes. If you need help quitting, ask your health care provider.  Do not use drugs.  If you are sexually active, practice safe sex. Use a condom or other form of protection to prevent STIs.  If you do not wish to become pregnant, use a form of birth control. If you plan to become pregnant, see your health care provider for a  prepregnancy visit.  Take aspirin only as told by your health care provider. Make sure that you understand how much to take and what form to take. Work with your health care provider to find out whether it is safe and beneficial for you to take aspirin daily.  Find healthy ways to manage stress, such as:  Meditation, yoga, or listening to music.  Journaling.  Talking to a trusted person.  Spending time with friends and family.  Minimize exposure to UV radiation to reduce your risk of skin cancer.  Safety  Always wear your seat belt while driving or riding in a vehicle.  Do not drive:  If you have been drinking alcohol. Do not ride with someone who has been drinking.  When you are tired or distracted.  While texting.  If you have been using any mind-altering substances or drugs.  Wear a helmet and other protective equipment during sports activities.  If you have firearms in your house, make sure you follow all gun safety procedures.  Seek help if you have been physically or sexually abused.  What's next?  Visit your health care provider once a year for an annual wellness visit.  Ask your health care provider how often you should have your eyes and teeth checked.  Stay up to date on all vaccines.  This information is not intended to replace advice given to you by your health care provider. Make sure you discuss any questions you have with your health care provider.  Document Revised: 03/01/2021 Document Reviewed: 03/01/2021  Elsevier Patient Education  2024 [employer].

## 2024-04-03 NOTE — Progress Notes (Signed)
 Natasha Clark Mar 29, 1976 [id number]   History:  48 y.o. G0 presents for annual exam. No gyn concerns.  Gynecologic History Patient's last menstrual period was 12/17/2023 (within weeks). Period Pattern: (!) Irregular Menstrual Flow: Light Menstrual Control: Panty liner Contraception/Family planning: OCP (estrogen/progesterone) Sexually active: yes Last Pap: 2024. Results were: normal Last mammogram: 03/13/24. Results were: normal  Obstetric History OB History  Gravida Para Term Preterm AB Living  0       SAB IAB Ectopic Multiple Live Births             04/03/2024    9:52 AM  Depression screen PHQ 2/9  Decreased Interest 0  Down, Depressed, Hopeless 0  PHQ - 2 Score 0     The following portions of the patient's history were reviewed and updated as appropriate: allergies, current medications, past family history, past medical history, past social history, past surgical history, and problem list.  Review of Systems  All other systems reviewed and are negative.   Past medical history, past surgical history, family history and social history were all reviewed and documented in the EPIC chart.  Exam:  Vitals:   04/03/24 0946  BP: 118/64  Pulse: 71  SpO2: 98%  Weight: 141 lb 9.6 oz (64.2 kg)  Height: 5' 4 (1.626 m)   Body mass index is 24.31 kg/m.  Physical Exam Vitals and nursing note reviewed. Exam conducted with a chaperone present.  Constitutional:      Appearance: Normal appearance. She is normal weight.  HENT:     Head: Normocephalic and atraumatic.  Neck:     Thyroid: No thyroid mass, thyromegaly or thyroid tenderness.  Cardiovascular:     Rate and Rhythm: Regular rhythm.     Heart sounds: Normal heart sounds.  Pulmonary:     Effort: Pulmonary effort is normal.     Breath sounds: Normal breath sounds.  Chest:  Breasts:    Breasts are symmetrical.     Right: Normal. No inverted nipple, mass, nipple discharge, skin change or tenderness.     Left:  Normal. No inverted nipple, mass, nipple discharge, skin change or tenderness.  Abdominal:     General: Abdomen is flat. Bowel sounds are normal.     Palpations: Abdomen is soft.  Genitourinary:    General: Normal vulva.     Vagina: Normal. No vaginal discharge, bleeding or lesions.     Cervix: Normal. No discharge or lesion.     Uterus: Normal. Not enlarged and not tender.      Adnexa: Right adnexa normal and left adnexa normal.       Right: No mass, tenderness or fullness.         Left: No mass, tenderness or fullness.    Lymphadenopathy:     Upper Body:     Right upper body: No axillary adenopathy.     Left upper body: No axillary adenopathy.  Skin:    General: Skin is warm and dry.  Neurological:     Mental Status: She is alert and oriented to person, place, and time.  Psychiatric:        Mood and Affect: Mood normal.        Thought Content: Thought content normal.        Judgment: Judgment normal.      Natasha Clark, CMA present for exam  Assessment/Plan:   1. Well woman exam with routine gynecological exam (Primary) Pap 2027 Establish PCP  2. Depression screen negative  3. Screen  for colon cancer - Cologuard  4. Encounter for surveillance of contraceptive pills - norethindrone-ethinyl estradiol-FE (BLISOVI FE 1/20) 1-20 MG-MCG tablet; Take 1 tablet by mouth daily.  Dispense: 84 tablet; Refill: 4    Return in about 1 year (around 04/03/2025) for Annual.  Natasha Clark B WHNP-BC 10:07 AM 04/03/2024

## 2024-04-10 ENCOUNTER — Ambulatory Visit
Admission: RE | Admit: 2024-04-10 | Discharge: 2024-04-10 | Disposition: A | Discharge status: Home / Self Care | Source: Ambulatory Visit | Attending: Radiology | Admitting: Radiology

## 2024-04-10 ENCOUNTER — Other Ambulatory Visit: Payer: Self-pay | Admitting: Radiology

## 2024-04-10 DIAGNOSIS — R921 Mammographic calcification found on diagnostic imaging of breast: Secondary | ICD-10-CM

## 2024-04-10 DIAGNOSIS — N6489 Other specified disorders of breast: Secondary | ICD-10-CM

## 2024-04-10 DIAGNOSIS — R928 Other abnormal and inconclusive findings on diagnostic imaging of breast: Secondary | ICD-10-CM

## 2024-04-10 DIAGNOSIS — N6320 Unspecified lump in the left breast, unspecified quadrant: Secondary | ICD-10-CM

## 2024-04-18 LAB — COLOGUARD: COLOGUARD: NEGATIVE

## 2024-04-20 ENCOUNTER — Ambulatory Visit: Payer: Self-pay | Admitting: Radiology

## 2024-05-10 ENCOUNTER — Other Ambulatory Visit: Payer: Self-pay | Admitting: Radiology

## 2024-05-10 DIAGNOSIS — Z3041 Encounter for surveillance of contraceptive pills: Secondary | ICD-10-CM

## 2024-05-11 NOTE — Telephone Encounter (Signed)
 Med refill request:  Norenthindrone-ethinyl estradiol (Blisovi FE 1/20) 1-20 MG-MCG tablet Last filled: 04/03/24 - #84 tablets with 4 refills Last AEX:  04/03/24 Next AEX: 04/09/25 Refill authorized? Please advise.

## 2024-10-16 ENCOUNTER — Other Ambulatory Visit: Payer: Self-pay | Admitting: Radiology

## 2024-10-16 ENCOUNTER — Ambulatory Visit
Admission: RE | Admit: 2024-10-16 | Discharge: 2024-10-16 | Disposition: A | Source: Ambulatory Visit | Attending: Radiology | Admitting: Radiology

## 2024-10-16 ENCOUNTER — Other Ambulatory Visit

## 2024-10-16 ENCOUNTER — Encounter

## 2024-10-16 DIAGNOSIS — R921 Mammographic calcification found on diagnostic imaging of breast: Secondary | ICD-10-CM

## 2024-10-16 DIAGNOSIS — N6489 Other specified disorders of breast: Secondary | ICD-10-CM

## 2024-10-16 DIAGNOSIS — R928 Other abnormal and inconclusive findings on diagnostic imaging of breast: Secondary | ICD-10-CM

## 2024-10-16 DIAGNOSIS — N6320 Unspecified lump in the left breast, unspecified quadrant: Secondary | ICD-10-CM

## 2025-04-09 ENCOUNTER — Ambulatory Visit: Admitting: Radiology

## 2025-04-16 ENCOUNTER — Encounter

## 2025-04-16 ENCOUNTER — Other Ambulatory Visit

## 2025-04-16 ENCOUNTER — Ambulatory Visit: Admitting: Radiology
